# Patient Record
Sex: Female | Born: 1965
Health system: Southern US, Community
[De-identification: ages and names within clinical notes are randomized; demographics above are authoritative.]

## PROBLEM LIST (undated history)

## (undated) DIAGNOSIS — Z8639 Personal history of other endocrine, nutritional and metabolic disease: Secondary | ICD-10-CM

## (undated) DIAGNOSIS — E785 Hyperlipidemia, unspecified: Secondary | ICD-10-CM

## (undated) HISTORY — PX: TONSILLECTOMY: SUR1361

## (undated) HISTORY — DX: Hyperlipidemia, unspecified: E78.5

## (undated) HISTORY — DX: Personal history of other endocrine, nutritional and metabolic disease: Z86.39

## (undated) HISTORY — PX: OTHER SURGICAL HISTORY: SHX169

---

## 2013-03-20 LAB — HM PAP SMEAR

## 2013-09-05 LAB — HM MAMMOGRAPHY: HM MAMMO: NORMAL (ref 0–4)

## 2014-07-07 LAB — LIPID PANEL
CHOLESTEROL: 217 mg/dL — AB (ref 0–200)
HDL: 57 mg/dL (ref 35–70)
LDL CALC: 121 mg/dL
LDl/HDL Ratio: 3.8
Triglycerides: 196 mg/dL — AB (ref 40–160)

## 2014-07-07 LAB — BASIC METABOLIC PANEL
Creatinine: 0.7 mg/dL (ref 0.5–1.1)
Glucose: 76 mg/dL
Potassium: 4.9 mmol/L (ref 3.4–5.3)
Sodium: 140 mmol/L (ref 137–147)

## 2014-07-07 LAB — HEPATIC FUNCTION PANEL
ALK PHOS: 56 U/L (ref 25–125)
ALT: 8 U/L (ref 7–35)
AST: 12 U/L — AB (ref 13–35)
Bilirubin, Total: 0.4 mg/dL

## 2014-07-07 LAB — TSH: TSH: 2.08 u[IU]/mL (ref 0.41–5.90)

## 2014-07-07 LAB — CBC AND DIFFERENTIAL
HCT: 43 % (ref 36–46)
Hemoglobin: 13.9 g/dL (ref 12.0–16.0)
NEUTROS ABS: 6607 /uL
PLATELETS: 260 10*3/uL (ref 150–399)
WBC: 9.1 10*3/mL

## 2014-07-07 LAB — VITAMIN B12: Vitamin B-12: 602

## 2016-04-04 LAB — HM PAP SMEAR

## 2016-05-09 ENCOUNTER — Other Ambulatory Visit: Payer: Self-pay | Admitting: Obstetrics and Gynecology

## 2016-05-09 DIAGNOSIS — R928 Other abnormal and inconclusive findings on diagnostic imaging of breast: Secondary | ICD-10-CM

## 2016-05-11 ENCOUNTER — Ambulatory Visit
Admission: RE | Admit: 2016-05-11 | Discharge: 2016-05-11 | Disposition: A | Discharge status: Home / Self Care | Payer: 59 | Source: Ambulatory Visit | Attending: Obstetrics and Gynecology | Admitting: Obstetrics and Gynecology

## 2016-05-11 DIAGNOSIS — R928 Other abnormal and inconclusive findings on diagnostic imaging of breast: Secondary | ICD-10-CM

## 2016-06-21 ENCOUNTER — Encounter: Payer: Self-pay | Admitting: General Practice

## 2016-09-19 ENCOUNTER — Ambulatory Visit: Payer: 59 | Admitting: Family Medicine

## 2016-10-02 ENCOUNTER — Encounter: Payer: Self-pay | Admitting: General Practice

## 2016-10-02 ENCOUNTER — Encounter: Payer: Self-pay | Admitting: Family Medicine

## 2016-10-02 ENCOUNTER — Ambulatory Visit (INDEPENDENT_AMBULATORY_CARE_PROVIDER_SITE_OTHER): Payer: 59 | Admitting: Family Medicine

## 2016-10-02 DIAGNOSIS — E669 Obesity, unspecified: Secondary | ICD-10-CM

## 2016-10-02 DIAGNOSIS — R7989 Other specified abnormal findings of blood chemistry: Secondary | ICD-10-CM

## 2016-10-02 DIAGNOSIS — E538 Deficiency of other specified B group vitamins: Secondary | ICD-10-CM | POA: Diagnosis not present

## 2016-10-02 DIAGNOSIS — E785 Hyperlipidemia, unspecified: Secondary | ICD-10-CM | POA: Diagnosis not present

## 2016-10-02 LAB — LIPID PANEL
Cholesterol: 195 mg/dL (ref 0–200)
HDL: 47.7 mg/dL (ref >39.00)
NONHDL: 147.26
Total CHOL/HDL Ratio: 4
Triglycerides: 201 mg/dL — ABNORMAL HIGH (ref 0.0–149.0)
VLDL: 40.2 mg/dL — AB (ref 0.0–40.0)

## 2016-10-02 LAB — HEPATIC FUNCTION PANEL
ALK PHOS: 54 U/L (ref 39–117)
ALT: 9 U/L (ref 0–35)
AST: 11 U/L (ref 0–37)
Albumin: 3.7 g/dL (ref 3.5–5.2)
BILIRUBIN TOTAL: 0.4 mg/dL (ref 0.2–1.2)
Bilirubin, Direct: 0.1 mg/dL (ref 0.0–0.3)
Total Protein: 6.3 g/dL (ref 6.0–8.3)

## 2016-10-02 LAB — BASIC METABOLIC PANEL
BUN: 12 mg/dL (ref 6–23)
CALCIUM: 9.2 mg/dL (ref 8.4–10.5)
CO2: 27 meq/L (ref 19–32)
Chloride: 101 mEq/L (ref 96–112)
Creatinine, Ser: 0.72 mg/dL (ref 0.40–1.20)
GFR: 91.43 mL/min (ref >60.00)
GLUCOSE: 86 mg/dL (ref 70–99)
POTASSIUM: 4.2 meq/L (ref 3.5–5.1)
SODIUM: 139 meq/L (ref 135–145)

## 2016-10-02 LAB — CBC WITH DIFFERENTIAL/PLATELET
BASOS PCT: 1 % (ref 0.0–3.0)
Basophils Absolute: 0.1 10*3/uL (ref 0.0–0.1)
EOS PCT: 2.6 % (ref 0.0–5.0)
Eosinophils Absolute: 0.2 10*3/uL (ref 0.0–0.7)
HEMATOCRIT: 43.1 % (ref 36.0–46.0)
Hemoglobin: 14.4 g/dL (ref 12.0–15.0)
LYMPHS ABS: 2.2 10*3/uL (ref 0.7–4.0)
LYMPHS PCT: 27.2 % (ref 12.0–46.0)
MCHC: 33.4 g/dL (ref 30.0–36.0)
MCV: 85.8 fl (ref 78.0–100.0)
MONOS PCT: 5.5 % (ref 3.0–12.0)
Monocytes Absolute: 0.4 10*3/uL (ref 0.1–1.0)
NEUTROS ABS: 5.1 10*3/uL (ref 1.4–7.7)
NEUTROS PCT: 63.7 % (ref 43.0–77.0)
PLATELETS: 268 10*3/uL (ref 150.0–400.0)
RBC: 5.02 Mil/uL (ref 3.87–5.11)
RDW: 14.2 % (ref 11.5–15.5)
WBC: 8.1 10*3/uL (ref 4.0–10.5)

## 2016-10-02 LAB — VITAMIN B12: Vitamin B-12: 332 pg/mL (ref 211–911)

## 2016-10-02 LAB — LDL CHOLESTEROL, DIRECT: Direct LDL: 133 mg/dL

## 2016-10-02 LAB — TSH: TSH: 3 u[IU]/mL (ref 0.35–4.50)

## 2016-10-02 MED ORDER — NALTREXONE-BUPROPION HCL ER 8-90 MG PO TB12: ORAL_TABLET | ORAL | 3 refills | Status: DC

## 2016-10-02 NOTE — Assessment & Plan Note (Signed)
New.  Pt reports she has had elevated levels in the past but has never been on meds.  Stressed need for healthy diet and regular exercise.  Check labs.  Start meds prn.

## 2016-10-02 NOTE — Patient Instructions (Addendum)
Follow up in 6-8 weeks to recheck weight loss progress We'll notify you of your lab results and make any changes if needed Continue to work on healthy diet and regular exercise- you can do it!! Start the Contrave as directed- 1 tab daily x1 week and then increase to 1 tab twice daily x1 week, then increase to 2 tabs in the AM and 1 tab in the evening x1 week and then get to goal of 2 tabs twice daily Increase your water intake Call with any questions or concerns Welcome!  We're glad to have you!!!

## 2016-10-02 NOTE — Assessment & Plan Note (Signed)
New.  Pt [place]  Stressed need for healthy diet and regular exercise.  Check labs to risk stratify.  Start Contrave at pt's request.  Reviewed appropriate use, possible side effects.  Pt expressed understanding and is in agreement w/ plan.

## 2016-10-02 NOTE — Progress Notes (Signed)
   Subjective:    Patient ID: Jacqueline Mcfarland, female    DOB: 09/13/1965, 51 y.o.   MRN: [id number]  HPI New to establish.  [facility]  Previous PCP[place]  B12 deficiency- pt is doing home B12 injxns every 10 days for last 7 yrs.  Last B12 level was within 6 months  Hyperlipidemia- pt reports hx of elevated cholesterol but never on medication.  Has been attempting to control w/ healthy diet and regular exercise.  Fish oil was recommended but not started.  Weight gain- pt [place] [facility]  Pt is interested in starting Contrave to help w/ weight loss.  Pt just started exercising daily which is what she used to do.  She also finds herself eating later in the evening.  No CP, SOB, HAs, edema, abd pain, N/V.   Review of Systems For ROS see HPI     Objective:   Physical Exam  Constitutional: She is oriented to person, place, and time. She appears well-developed and well-nourished. No distress.  obese  HENT:  Head: Normocephalic and atraumatic.  Eyes: Pupils are equal, round, and reactive to light. Conjunctivae and EOM are normal.  Neck: Normal range of motion. Neck supple. No thyromegaly present.  Cardiovascular: Normal rate, regular rhythm, normal heart sounds and intact distal pulses.   No murmur heard. Pulmonary/Chest: Effort normal and breath sounds normal. No respiratory distress.  Abdominal: Soft. She exhibits no distension. There is no tenderness.  Musculoskeletal: She exhibits no edema.  Lymphadenopathy:    She has no cervical adenopathy.  Neurological: She is alert and oriented to person, place, and time.  Skin: Skin is warm and dry.  Psychiatric: She has a normal mood and affect. Her behavior is normal.  Vitals reviewed.         Assessment & Plan:

## 2016-10-02 NOTE — Progress Notes (Signed)
Pre visit review using our clinic review tool, if applicable. No additional management support is needed unless otherwise documented below in the visit note. 

## 2016-10-02 NOTE — Assessment & Plan Note (Signed)
New to provider, ongoing for pt.  She is doing home injxns.  Check level.  Refill prn.

## 2016-10-20 ENCOUNTER — Ambulatory Visit (INDEPENDENT_AMBULATORY_CARE_PROVIDER_SITE_OTHER): Payer: 59 | Admitting: Physician Assistant

## 2016-10-20 ENCOUNTER — Encounter: Payer: Self-pay | Admitting: Physician Assistant

## 2016-10-20 VITALS — BP 118/80 | HR 76 | Temp 98.2°F | Resp 17 | Ht 66.0 in | Wt 194.0 lb

## 2016-10-20 DIAGNOSIS — J0100 Acute maxillary sinusitis, unspecified: Secondary | ICD-10-CM

## 2016-10-20 MED ORDER — FLUTICASONE PROPIONATE 50 MCG/ACT NA SUSP: 2.0000 | Freq: Every day | NASAL | 0 refills | Status: DC

## 2016-10-20 MED ORDER — AMOXICILLIN-POT CLAVULANATE 875-125 MG PO TABS: 1.0000 | ORAL_TABLET | Freq: Two times a day (BID) | ORAL | 0 refills | Status: DC

## 2016-10-20 NOTE — Progress Notes (Signed)
Pre visit review using our clinic review tool, if applicable. No additional management support is needed unless otherwise documented below in the visit note. 

## 2016-10-20 NOTE — Progress Notes (Signed)
Patient presents to clinic today c/o several days of ear pressure and sinus pressure now with facial/maxillary pain bilaterally with R ear pain. Denies fever, chills or aches. Has felt fatigued. Denies recent travel or sick contact. Is taking Ibuprofen which is helping somewhat. Patient with history significant for sinusitis.   Past Medical History:  Diagnosis Date  . H/O non anemic vitamin B12 deficiency   . Hyperlipidemia     Current Outpatient Prescriptions on File Prior to Visit  Medication Sig Dispense Refill  . cyanocobalamin (,VITAMIN B-12,) 1000 MCG/ML injection cyanocobalamin (vit B-12) 1,000 mcg/mL injection solution  INJECT 1 ML  Q 10 DAYS AS DIRECTED    . Norgestimate-Ethinyl Estradiol Triphasic (TRI-LO-SPRINTEC) 0.18/0.215/0.25 MG-25 MCG tab Take 1 tablet by mouth daily.    . Naltrexone-Bupropion HCl ER (CONTRAVE) 8-90 MG TB12 1 tab daily x1 week, then 1 tab BID x1 week, then 2 tabs qAM and 1 tab qPM x1 week, then 2 tabs BID (Patient not taking: Reported on 10/20/2016) 120 tablet 3   No current facility-administered medications on file prior to visit.     No Known Allergies  Family History  Problem Relation Age of Onset  . Diabetes Mother   . Hyperlipidemia Mother   . COPD Mother   . Heart disease Mother   . Cancer Father        lung, liver, and pancreatic  . Stroke Father   . Diabetes Brother   . Mental illness Maternal Grandfather   . Heart disease Maternal Grandfather     Social History   Social History  . Marital status: Married    Spouse name: N/A  . Number of children: N/A  . Years of education: N/A   Social History Main Topics  . Smoking status: Former Smoker    Types: Cigarettes    Quit date: 10/03/1991  . Smokeless tobacco: Never Used  . Alcohol use Yes  . Drug use: No  . Sexual activity: Yes   Other Topics Concern  . None   Social History Narrative  . None   Review of Systems - See HPI.  All other ROS are negative.  BP 118/80   Pulse  76   Temp 98.2 F (36.8 C) (Oral)   Resp 17   Ht 5\' 6"  (1.676 m)   Wt 194 lb (88 kg)   SpO2 98%   BMI 31.31 kg/m   Physical Exam  Constitutional: She is oriented to person, place, and time and well-developed, well-nourished, and in no distress.  HENT:  Head: Normocephalic and atraumatic.  Right Ear: Tympanic membrane is not erythematous and not bulging.  Left Ear: Tympanic membrane is not erythematous and not bulging.  Nose: Mucosal edema and rhinorrhea present. Right sinus exhibits maxillary sinus tenderness. Left sinus exhibits maxillary sinus tenderness.  Mouth/Throat: Uvula is midline, oropharynx is clear and moist and mucous membranes are normal.  + serous fluid noted behind TM bilaterally.   Eyes: Conjunctivae are normal.  Neck: Neck supple.  Cardiovascular: Normal rate, regular rhythm, normal heart sounds and intact distal pulses.   Pulmonary/Chest: Effort normal and breath sounds normal. No respiratory distress. She has no wheezes. She has no rales. She exhibits no tenderness.  Lymphadenopathy:    She has no cervical adenopathy.  Neurological: She is alert and oriented to person, place, and time.  Skin: Skin is warm and dry. No rash noted.  Psychiatric: Affect normal.  Vitals reviewed.  Recent Results (from the past 2160 hour(s))  Lipid  panel     Status: Abnormal   Collection Time: 10/02/16  8:48 AM  Result Value Ref Range   Cholesterol 195 0 - 200 mg/dL    Comment: ATP III Classification       Desirable:  < 200 mg/dL               Borderline High:  200 - 239 mg/dL          High:  > = 161 mg/dL   Triglycerides 096.0 (H) 0.0 - 149.0 mg/dL    Comment: Normal:  <454 mg/dLBorderline High:  150 - 199 mg/dL   HDL 09.81 >19.14 mg/dL   VLDL 78.2 (H) 0.0 - 95.6 mg/dL   Total CHOL/HDL Ratio 4     Comment:                Men          Women1/2 Average Risk     3.4          3.3Average Risk          5.0          4.42X Average Risk          9.6          7.13X Average Risk           15.0          11.0                       NonHDL 147.26     Comment: NOTE:  Non-HDL goal should be 30 mg/dL higher than patient's LDL goal (i.e. LDL goal of < 70 mg/dL, would have non-HDL goal of < 100 mg/dL)  Basic metabolic panel     Status: None   Collection Time: 10/02/16  8:48 AM  Result Value Ref Range   Sodium 139 135 - 145 mEq/L   Potassium 4.2 3.5 - 5.1 mEq/L   Chloride 101 96 - 112 mEq/L   CO2 27 19 - 32 mEq/L   Glucose, Bld 86 70 - 99 mg/dL   BUN 12 6 - 23 mg/dL   Creatinine, Ser 2.13 0.40 - 1.20 mg/dL   Calcium 9.2 8.4 - 08.6 mg/dL   GFR 57.84 >69.62 mL/min  TSH     Status: None   Collection Time: 10/02/16  8:48 AM  Result Value Ref Range   TSH 3.00 0.35 - 4.50 uIU/mL  Hepatic function panel     Status: None   Collection Time: 10/02/16  8:48 AM  Result Value Ref Range   Total Bilirubin 0.4 0.2 - 1.2 mg/dL   Bilirubin, Direct 0.1 0.0 - 0.3 mg/dL   Alkaline Phosphatase 54 39 - 117 U/L   AST 11 0 - 37 U/L   ALT 9 0 - 35 U/L   Total Protein 6.3 6.0 - 8.3 g/dL   Albumin 3.7 3.5 - 5.2 g/dL  CBC with Differential/Platelet     Status: None   Collection Time: 10/02/16  8:48 AM  Result Value Ref Range   WBC 8.1 4.0 - 10.5 K/uL   RBC 5.02 3.87 - 5.11 Mil/uL   Hemoglobin 14.4 12.0 - 15.0 g/dL   HCT 95.2 84.1 - 32.4 %   MCV 85.8 78.0 - 100.0 fl   MCHC 33.4 30.0 - 36.0 g/dL   RDW 40.1 02.7 - 25.3 %   Platelets 268.0 150.0 - 400.0 K/uL   Neutrophils Relative % 63.7 43.0 - 77.0 %  Lymphocytes Relative 27.2 12.0 - 46.0 %   Monocytes Relative 5.5 3.0 - 12.0 %   Eosinophils Relative 2.6 0.0 - 5.0 %   Basophils Relative 1.0 0.0 - 3.0 %   Neutro Abs 5.1 1.4 - 7.7 K/uL   Lymphs Abs 2.2 0.7 - 4.0 K/uL   Monocytes Absolute 0.4 0.1 - 1.0 K/uL   Eosinophils Absolute 0.2 0.0 - 0.7 K/uL   Basophils Absolute 0.1 0.0 - 0.1 K/uL  B12     Status: None   Collection Time: 10/02/16  8:48 AM  Result Value Ref Range   Vitamin B-12 332 211 - 911 pg/mL  LDL cholesterol, direct     Status:  None   Collection Time: 10/02/16  8:48 AM  Result Value Ref Range   Direct LDL 133.0 mg/dL    Comment: Optimal:  <960<100 mg/dLNear or Above Optimal:  100-129 mg/dLBorderline High:  130-159 mg/dLHigh:  160-189 mg/dLVery High:  >190 mg/dL   Assessment/Plan: 1. Acute non-recurrent maxillary sinusitis Rx Augmentin and Flonase.  Increase fluids.  Rest.  Saline nasal spray.  Probiotic.  Mucinex as directed.  Humidifier in bedroom.  Call or return to clinic if symptoms are not improving.  - amoxicillin-clavulanate (AUGMENTIN) 875-125 MG tablet; Take 1 tablet by mouth 2 (two) times daily.  Dispense: 14 tablet; Refill: 0 - fluticasone (FLONASE) 50 MCG/ACT nasal spray; Place 2 sprays into both nostrils daily.  Dispense: 16 g; Refill: 0   Piedad ClimesMartin, Damita Eppard [place], [place]

## 2016-10-20 NOTE — Patient Instructions (Signed)
Please take antibiotic as directed.  Increase fluid intake.  Use Saline nasal spray.  Take a daily multivitamin. Start Flonase as directed (I have sent in a script for this).  Place a humidifier in the bedroom.  Please call or return clinic if symptoms are not improving.  Sinusitis Sinusitis is redness, soreness, and swelling (inflammation) of the paranasal sinuses. Paranasal sinuses are air pockets within the bones of your face (beneath the eyes, the middle of the forehead, or above the eyes). In healthy paranasal sinuses, mucus is able to drain out, and air is able to circulate through them by way of your nose. However, when your paranasal sinuses are inflamed, mucus and air can become trapped. This can allow bacteria and other germs to grow and cause infection. Sinusitis can develop quickly and last only a short time (acute) or continue over a long period (chronic). Sinusitis that lasts for more than 12 weeks is considered chronic.  CAUSES  Causes of sinusitis include:  Allergies.  Structural abnormalities, such as displacement of the cartilage that separates your nostrils (deviated septum), which can decrease the air flow through your nose and sinuses and affect sinus drainage.  Functional abnormalities, such as when the small hairs (cilia) that line your sinuses and help remove mucus do not work properly or are not present. SYMPTOMS  Symptoms of acute and chronic sinusitis are the same. The primary symptoms are pain and pressure around the affected sinuses. Other symptoms include:  Upper toothache.  Earache.  Headache.  Bad breath.  Decreased sense of smell and taste.  A cough, which worsens when you are lying flat.  Fatigue.  Fever.  Thick drainage from your nose, which often is green and may contain pus (purulent).  Swelling and warmth over the affected sinuses. DIAGNOSIS  Your caregiver will perform a physical exam. During the exam, your caregiver may:  Look in your nose  for signs of abnormal growths in your nostrils (nasal polyps).  Tap over the affected sinus to check for signs of infection.  View the inside of your sinuses (endoscopy) with a special imaging device with a light attached (endoscope), which is inserted into your sinuses. If your caregiver suspects that you have chronic sinusitis, one or more of the following tests may be recommended:  Allergy tests.  Nasal culture A sample of mucus is taken from your nose and sent to a lab and screened for bacteria.  Nasal cytology A sample of mucus is taken from your nose and examined by your caregiver to determine if your sinusitis is related to an allergy. TREATMENT  Most cases of acute sinusitis are related to a viral infection and will resolve on their own within 10 days. Sometimes medicines are prescribed to help relieve symptoms (pain medicine, decongestants, nasal steroid sprays, or saline sprays).  However, for sinusitis related to a bacterial infection, your caregiver will prescribe antibiotic medicines. These are medicines that will help kill the bacteria causing the infection.  Rarely, sinusitis is caused by a fungal infection. In theses cases, your caregiver will prescribe antifungal medicine. For some cases of chronic sinusitis, surgery is needed. Generally, these are cases in which sinusitis recurs more than 3 times per year, despite other treatments. HOME CARE INSTRUCTIONS   Drink plenty of water. Water helps thin the mucus so your sinuses can drain more easily.  Use a humidifier.  Inhale steam 3 to 4 times a day (for example, sit in the bathroom with the shower running).  Apply a  warm, moist washcloth to your face 3 to 4 times a day, or as directed by your caregiver.  Use saline nasal sprays to help moisten and clean your sinuses.  Take over-the-counter or prescription medicines for pain, discomfort, or fever only as directed by your caregiver. SEEK IMMEDIATE MEDICAL CARE IF:  You  have increasing pain or severe headaches.  You have nausea, vomiting, or drowsiness.  You have swelling around your face.  You have vision problems.  You have a stiff neck.  You have difficulty breathing. MAKE SURE YOU:   Understand these instructions.  Will watch your condition.  Will get help right away if you are not doing well or get worse. Document Released: 03/06/2005 Document Revised: 05/29/2011 Document Reviewed: 03/21/2011 [facility] Patient Information 2014 [employer] [place].

## 2016-10-23 ENCOUNTER — Other Ambulatory Visit: Payer: Self-pay | Admitting: Obstetrics and Gynecology

## 2016-10-23 DIAGNOSIS — N6489 Other specified disorders of breast: Secondary | ICD-10-CM

## 2016-11-01 ENCOUNTER — Ambulatory Visit (INDEPENDENT_AMBULATORY_CARE_PROVIDER_SITE_OTHER): Payer: 59 | Admitting: Family Medicine

## 2016-11-01 ENCOUNTER — Encounter: Payer: Self-pay | Admitting: Family Medicine

## 2016-11-01 VITALS — BP 118/78 | HR 78 | Temp 98.5°F | Resp 17 | Ht 66.0 in | Wt 196.1 lb

## 2016-11-01 DIAGNOSIS — J0110 Acute frontal sinusitis, unspecified: Secondary | ICD-10-CM | POA: Diagnosis not present

## 2016-11-01 MED ORDER — SULFAMETHOXAZOLE-TRIMETHOPRIM 800-160 MG PO TABS: 1.0000 | ORAL_TABLET | Freq: Two times a day (BID) | ORAL | 0 refills | Status: DC

## 2016-11-01 NOTE — Progress Notes (Signed)
   Subjective:    Patient ID: Jacqueline Mcfarland, female    DOB: 08/30/1965, 51 y.o.   MRN: [id number]  HPI Sinusitis- pt was seen 8/3 and dx'd w/ sinus infxn.  Started on Augmentin but stopped meds after only completing 1/2 course (5 days) due to GI issues.  Pt feels sinus infxn has never resolved.  Continues to have facial pain, ear pain- having popping and clicking in her R (deaf) ear.  No fevers.  Pt has flight upcoming and her deafness is due to barotrauma from previous flight.   Review of Systems For ROS see HPI     Objective:   Physical Exam  Constitutional: She appears well-developed and well-nourished. No distress.  HENT:  Head: Normocephalic and atraumatic.  Right Ear: Tympanic membrane normal.  Left Ear: Tympanic membrane normal.  Nose: Mucosal edema and rhinorrhea present. Right sinus exhibits frontal sinus tenderness. Right sinus exhibits no maxillary sinus tenderness. Left sinus exhibits frontal sinus tenderness. Left sinus exhibits no maxillary sinus tenderness.  Mouth/Throat: Uvula is midline and mucous membranes are normal. Posterior oropharyngeal erythema present. No oropharyngeal exudate.  Eyes: Pupils are equal, round, and reactive to light. Conjunctivae and EOM are normal.  Neck: Normal range of motion. Neck supple.  Cardiovascular: Normal rate, regular rhythm and normal heart sounds.   Pulmonary/Chest: Effort normal and breath sounds normal. No respiratory distress. She has no wheezes.  Lymphadenopathy:    She has no cervical adenopathy.  Vitals reviewed.         Assessment & Plan:  Frontal sinusitis- new to provider, ongoing for pt.  Incomplete tx of previous infxn.  Switch to Bactrim due to intolerance to Augmentin.  Stressed need to use Flonase daily- at least until feeling better.  Reviewed supportive care and red flags that should prompt return.  Pt expressed understanding and is in agreement w/ plan.

## 2016-11-01 NOTE — Patient Instructions (Signed)
Follow up as needed or scheduled Start the Bactrim twice daily- take w/ food Restart the Flonase- 2 sprays each nostril until feeling better Drink plenty of fluids Call with any questions or concerns ENJOY YOUR TRIP!!!

## 2016-11-01 NOTE — Progress Notes (Signed)
Pre visit review using our clinic review tool, if applicable. No additional management support is needed unless otherwise documented below in the visit note. 

## 2016-11-08 ENCOUNTER — Telehealth: Payer: Self-pay | Admitting: Family Medicine

## 2016-11-08 DIAGNOSIS — H9209 Otalgia, unspecified ear: Secondary | ICD-10-CM

## 2016-11-08 DIAGNOSIS — H938X9 Other specified disorders of ear, unspecified ear: Secondary | ICD-10-CM

## 2016-11-08 NOTE — Telephone Encounter (Signed)
Please advise 

## 2016-11-08 NOTE — Telephone Encounter (Signed)
Recommend daily Zyrtec and Flonase.  Add short term sudafed (3-5 days) to improve ear pressure.  Drink LOTS of fluids.  No need for additional abx at this time

## 2016-11-08 NOTE — Telephone Encounter (Signed)
Pt states that she is still feeling bad from her visit 8/15 from sinus, pt states that she still has pressure in her ear, pt asking what should she do, please advise

## 2016-11-08 NOTE — Telephone Encounter (Signed)
Patient notified of PCP recommendations and is agreement and expresses an understanding.  

## 2016-11-10 ENCOUNTER — Ambulatory Visit: Payer: 59 | Admitting: Physician Assistant

## 2016-11-10 NOTE — Telephone Encounter (Addendum)
Patient has scheduled an appointment with [facility] this afternoon.  She would like [place] to see if we can get her in with an ENT today, if so, we can cancel the appointment with [facility], if not she will come here to be seen.  Marylene Land is aware of referral and will begin working on it.

## 2016-11-10 NOTE — Telephone Encounter (Signed)
Patient states that she is miserable and cannot continue to feel like this. She is requesting an urgent referral to an ENT to see if there is any way she can be seen today because she does not want to go through the weekend like this.

## 2016-11-10 NOTE — Telephone Encounter (Signed)
Per Marylene Land, Patient scheduled at an ENT office today.  Appt with [facility] cancelled.   Patient aware.

## 2016-11-10 NOTE — Telephone Encounter (Signed)
We can certainly refer to ENT but i'm not sure they will be able to see her today.  If her sxs are that severe and ENT is unable to see her, I recommend that she have an appt to be evaluated either here in our office or at another site.

## 2016-11-10 NOTE — Addendum Note (Signed)
Addended by: Lenis Dickinson on: 11/10/2016 10:30 AM   Modules accepted: Orders

## 2016-11-10 NOTE — Telephone Encounter (Signed)
Can you see if she is willing to be seen?

## 2016-11-14 ENCOUNTER — Ambulatory Visit: Payer: 59 | Admitting: Family Medicine

## 2016-11-21 ENCOUNTER — Ambulatory Visit
Admission: RE | Admit: 2016-11-21 | Discharge: 2016-11-21 | Disposition: A | Discharge status: Home / Self Care | Payer: 59 | Source: Ambulatory Visit | Attending: Obstetrics and Gynecology | Admitting: Obstetrics and Gynecology

## 2016-11-21 DIAGNOSIS — N6489 Other specified disorders of breast: Secondary | ICD-10-CM

## 2017-01-19 ENCOUNTER — Ambulatory Visit: Payer: 59 | Admitting: Physician Assistant

## 2017-01-19 DIAGNOSIS — Z0289 Encounter for other administrative examinations: Secondary | ICD-10-CM

## 2017-05-07 ENCOUNTER — Encounter: Payer: Self-pay | Admitting: General Practice

## 2017-05-18 LAB — HM MAMMOGRAPHY: HM Mammogram: NORMAL (ref 0–4)

## 2017-06-20 ENCOUNTER — Ambulatory Visit: Payer: Self-pay | Admitting: *Deleted

## 2017-06-20 ENCOUNTER — Other Ambulatory Visit: Payer: Self-pay

## 2017-06-20 ENCOUNTER — Ambulatory Visit: Payer: 59 | Admitting: Family Medicine

## 2017-06-20 ENCOUNTER — Encounter: Payer: Self-pay | Admitting: Family Medicine

## 2017-06-20 VITALS — BP 121/72 | HR 70 | Temp 98.1°F | Resp 16 | Ht 66.0 in | Wt 191.5 lb

## 2017-06-20 DIAGNOSIS — E538 Deficiency of other specified B group vitamins: Secondary | ICD-10-CM | POA: Diagnosis not present

## 2017-06-20 DIAGNOSIS — Z724 Inappropriate diet and eating habits: Secondary | ICD-10-CM

## 2017-06-20 DIAGNOSIS — H8110 Benign paroxysmal vertigo, unspecified ear: Secondary | ICD-10-CM

## 2017-06-20 DIAGNOSIS — E860 Dehydration: Secondary | ICD-10-CM

## 2017-06-20 LAB — CBC WITH DIFFERENTIAL/PLATELET
BASOS ABS: 0 10*3/uL (ref 0.0–0.1)
Basophils Relative: 0.4 % (ref 0.0–3.0)
Eosinophils Absolute: 0.1 10*3/uL (ref 0.0–0.7)
Eosinophils Relative: 1.4 % (ref 0.0–5.0)
HCT: 43.8 % (ref 36.0–46.0)
Hemoglobin: 14.8 g/dL (ref 12.0–15.0)
LYMPHS ABS: 2.8 10*3/uL (ref 0.7–4.0)
Lymphocytes Relative: 28.1 % (ref 12.0–46.0)
MCHC: 33.8 g/dL (ref 30.0–36.0)
MCV: 85.5 fl (ref 78.0–100.0)
MONOS PCT: 5 % (ref 3.0–12.0)
Monocytes Absolute: 0.5 10*3/uL (ref 0.1–1.0)
NEUTROS PCT: 65.1 % (ref 43.0–77.0)
Neutro Abs: 6.6 10*3/uL (ref 1.4–7.7)
PLATELETS: 330 10*3/uL (ref 150.0–400.0)
RBC: 5.13 Mil/uL — AB (ref 3.87–5.11)
RDW: 14.5 % (ref 11.5–15.5)
WBC: 10.1 10*3/uL (ref 4.0–10.5)

## 2017-06-20 LAB — BASIC METABOLIC PANEL
BUN: 16 mg/dL (ref 6–23)
CALCIUM: 9.6 mg/dL (ref 8.4–10.5)
CO2: 29 mEq/L (ref 19–32)
Chloride: 99 mEq/L (ref 96–112)
Creatinine, Ser: 0.76 mg/dL (ref 0.40–1.20)
GFR: 85.65 mL/min (ref >60.00)
Glucose, Bld: 78 mg/dL (ref 70–99)
Potassium: 4.7 mEq/L (ref 3.5–5.1)
SODIUM: 137 meq/L (ref 135–145)

## 2017-06-20 LAB — TSH: TSH: 1.62 u[IU]/mL (ref 0.35–4.50)

## 2017-06-20 LAB — VITAMIN B12: VITAMIN B 12: 525 pg/mL (ref 211–911)

## 2017-06-20 MED ORDER — MECLIZINE HCL 25 MG PO TABS: 25.0000 mg | ORAL_TABLET | Freq: Three times a day (TID) | ORAL | 0 refills | Status: DC | PRN

## 2017-06-20 MED ORDER — FLUTICASONE PROPIONATE 50 MCG/ACT NA SUSP: 2.0000 | Freq: Every day | NASAL | 6 refills | Status: DC

## 2017-06-20 NOTE — Telephone Encounter (Signed)
Patient is reporting dizzy spells for a week now- gets cold sweat and feels like she is going to pass out- very random  Reason for Disposition . [1] MODERATE dizziness (e.g., interferes with normal activities) AND [2] has NOT been evaluated by physician for this  (Exception: dizziness caused by heat exposure, sudden standing, or poor fluid intake)  Answer Assessment - Initial Assessment Questions 1. DESCRIPTION: "Describe your dizziness."     Cold sweat and feels dizzy 2. LIGHTHEADED: "Do you feel lightheaded?" (e.g., somewhat faint, woozy, weak upon standing)     Woozy and faint 3. VERTIGO: "Do you feel like either you or the room is spinning or tilting?" (i.e. vertigo)     no 4. SEVERITY: "How bad is it?"  "Do you feel like you are going to faint?" "Can you stand and walk?"   - MILD - walking normally   - MODERATE - interferes with normal activities (e.g., work, school)    - SEVERE - unable to stand, requires support to walk, feels like passing out now.      Patient can not walk or stand when occurring-moderate 5. ONSET:  "When did the dizziness begin?"     Started last Wednesday- patient was on airplane- patient never feels 100%- but she does feel better 6. AGGRAVATING FACTORS: "Does anything make it worse?" (e.g., standing, change in head position)     No- very random- possibly quick turns- looking up/down 7. HEART RATE: "Can you tell me your heart rate?" "How many beats in 15 seconds?"  (Note: not all patients can do this)       60- in minute 8. CAUSE: "What do you think is causing the dizziness?"     unsure 9. RECURRENT SYMPTOM: "Have you had dizziness before?" If so, ask: "When was the last time?" "What happened that time?"     Random and recurrent- today 10. OTHER SYMPTOMS: "Do you have any other symptoms?" (e.g., fever, chest pain, vomiting, diarrhea, bleeding)       No- appetite has dropped- lost 5-6 lbs this week- patient is eating  11. PREGNANCY: "Is there any chance you are  pregnant?" "When was your last menstrual period?"       LMP- last week  Protocols used: DIZZINESS [facility]- LIGHTHEADEDNESS-A-AH

## 2017-06-20 NOTE — Assessment & Plan Note (Signed)
Chronic problem.  Repeat labs and replete prn.

## 2017-06-20 NOTE — Progress Notes (Signed)
   Subjective:    Patient ID: Jacqueline Mcfarland, female    DOB: 11/06/1965, 52 y.o.   MRN: [id number]  HPI Dizziness- sxs started 1 week ago.  Pt has flown x4 in the last 2 weeks.  Pt reports 'it just comes on out of nowhere'.  This AM was sitting at her desk and had a 'cold sweat, nausea' but this does not happen w/ each episode.  Episode this AM lasted 15 minutes.  2 days ago felt better after eating.  Pt is not eating regularly and is 'terrible' about drinking fluids.  Pt reports her water intake has always been poor but the change in eating is recent- husband thinks loss of appetite is stress related.  Husband says work and home are stressful.  No foster child.  No dizziness w/ checking blind spot, some dizziness with turning head at work.  + HAs w/ dizziness- improves w/ ibuprofen.  Dizziness is described as pt spinning.   Review of Systems For ROS see HPI     Objective:   Physical Exam  Constitutional: She is oriented to person, place, and time. She appears well-developed and well-nourished. No distress.  HENT:  Head: Normocephalic and atraumatic.  Mouth/Throat: Uvula is midline and mucous membranes are normal.  R TM retracted, L TM WNL No TTP over sinuses Minimal nasal congestion  Eyes: Pupils are equal, round, and reactive to light. Conjunctivae and EOM are normal.  3-4 beats of horizontal nystagmus when looking R  Neck: Normal range of motion. Neck supple.  Cardiovascular: Normal rate, regular rhythm, normal heart sounds and intact distal pulses.  Pulmonary/Chest: Effort normal and breath sounds normal. No respiratory distress. She has no wheezes. She has no rales.  Musculoskeletal: She exhibits no edema.  Lymphadenopathy:    She has no cervical adenopathy.  Neurological: She is alert and oriented to person, place, and time. She has normal reflexes. No cranial nerve deficit.  Skin: Skin is warm and dry.  Psychiatric: Her behavior is normal. Judgment and thought content normal.    tearful  Vitals reviewed.         Assessment & Plan:  BPV- new.  Suspect pt's dizziness is multifactorial- dehydration, erratic eating, and BPV after recent flights and eustachian tube dysfunction.  Reviewed dx and tx.  Start Meclizine PRN, Flonase daily.  Reviewed supportive care and red flags that should prompt return.  Pt expressed understanding and is in agreement w/ plan.   Dehydration- pt drinks 'maybe 1 cup of coffee' daily.  Stressed need for increased fluid intake as her HR jumped 44 points from sitting to standing.  Pt expressed understanding and is in agreement w/ plan.   Erractic eating- stressed need for at least 3 meals daily to avoid hypoglycemic episodes (which is likely contributing to dizziness).  Also discussed protein based snacks late morning and late afternoon.  Pt expressed understanding and is in agreement w/ plan.

## 2017-06-20 NOTE — Patient Instructions (Signed)
Follow up as needed or as scheduled We'll notify you of your lab results and make any changes if needed Make sure you increase your fluid/water intake!  Start small w/ a goal of 1 water bottle in the morning and 1 in the afternoon Make sure you are eating regularly- at least 3 meals daily, but ideally snacks as well (protein based is best) Use the Meclizine as needed for dizziness Change positions slowly- allow your body time to adjust Call with any questions or concerns Hang in there!  You can do this!!

## 2017-06-21 ENCOUNTER — Encounter: Payer: Self-pay | Admitting: General Practice

## 2017-06-22 ENCOUNTER — Other Ambulatory Visit: Payer: Self-pay | Admitting: General Practice

## 2017-06-22 ENCOUNTER — Encounter: Payer: Self-pay | Admitting: Family Medicine

## 2017-06-22 MED ORDER — CYANOCOBALAMIN 1000 MCG/ML IJ SOLN: INTRAMUSCULAR | 6 refills | Status: DC

## 2017-07-27 ENCOUNTER — Ambulatory Visit: Payer: 59 | Admitting: Family Medicine

## 2017-07-27 ENCOUNTER — Other Ambulatory Visit: Payer: Self-pay

## 2017-07-27 ENCOUNTER — Encounter: Payer: Self-pay | Admitting: Family Medicine

## 2017-07-27 ENCOUNTER — Ambulatory Visit (INDEPENDENT_AMBULATORY_CARE_PROVIDER_SITE_OTHER): Payer: 59

## 2017-07-27 VITALS — BP 122/78 | HR 75 | Temp 98.0°F | Wt 196.0 lb

## 2017-07-27 DIAGNOSIS — J0100 Acute maxillary sinusitis, unspecified: Secondary | ICD-10-CM

## 2017-07-27 DIAGNOSIS — M542 Cervicalgia: Secondary | ICD-10-CM

## 2017-07-27 MED ORDER — MELOXICAM 7.5 MG PO TABS: 7.5000 mg | ORAL_TABLET | Freq: Two times a day (BID) | ORAL | 0 refills | Status: DC

## 2017-07-27 MED ORDER — AMOXICILLIN 875 MG PO TABS: 875.0000 mg | ORAL_TABLET | Freq: Two times a day (BID) | ORAL | 0 refills | Status: AC

## 2017-07-27 MED ORDER — CYCLOBENZAPRINE HCL 10 MG PO TABS: 10.0000 mg | ORAL_TABLET | Freq: Every day | ORAL | 0 refills | Status: DC

## 2017-07-27 NOTE — Progress Notes (Signed)
Subjective   CC:  Chief Complaint  Patient presents with  . Ear Pain    Right Ear Pain, x 2 months, Doesn't seem to be going away.  . Neck Pain    Right sided x 3 weeks, patient reports that she is very tender in that area.     HPI: Jacqueline Mcfarland is a 52 y.o. female who presents to the office today to address the problems listed above in the chief complaint.  Patient complains of typical URI symptoms including nasal congestion, pnd, mild sore throat,and now dental pain that restarted this am. She had URI sxs with ST and intermittent sharp ear pain since 4/5 - see telephone note. Also, with stiff right neck with pain with neck mvt. No injuyr. No prior neck problems. She denies high fever or productive cough, shortness of breath or significant GI symptoms.  Over-the-counter cold medicines have been minimally or mildly helpful. Her dizziness has resolved. Reports her shoulder feels sore but no radicular sxs. She has used hot water in shower, ice and advil for neck pain with only minimal relief.  I reviewed the patients updated PMH, FH, and SocHx.    Patient Active Problem List   Diagnosis Date Noted  . Obesity (BMI 30.0-34.9) 10/02/2016  . B12 deficiency 10/02/2016  . Hyperlipidemia 10/02/2016   Current Meds  Medication Sig  . cyanocobalamin (,VITAMIN B-12,) 1000 MCG/ML injection cyanocobalamin (vit B-12) 1,000 mcg/mL injection solution  INJECT 1 ML  Q 10 DAYS AS DIRECTED  . fluticasone (FLONASE) 50 MCG/ACT nasal spray Place 2 sprays into both nostrils daily.  . meclizine (ANTIVERT) 25 MG tablet Take 1 tablet (25 mg total) by mouth 3 (three) times daily as needed for dizziness.  . Norgestimate-Ethinyl Estradiol Triphasic (TRI-LO-SPRINTEC) 0.18/0.215/0.25 MG-25 MCG tab Take 1 tablet by mouth daily.    Review of Systems: Constitutional: Negative for fever malaise or anorexia Cardiovascular: negative for chest pain Respiratory: negative for SOB or pleuritic chest  pain Gastrointestinal: negative for abdominal pain  Objective  Vitals: BP 122/78   Pulse 75   Temp 98 F (36.7 C)   Wt 196 lb (88.9 kg)   LMP 07/13/2017   BMI 31.64 kg/m  General: no acute respiratory distress , sits with stiff neck Psych:  Alert and oriented, normal mood and affect HEENT: Normocephalic, nasal congestion present, TMs w/o erythema, nl landmarks, OP with erythema w/o exudate, + cervical LAD, supple neck but pain with right and left rotation and forward flexion; + paracervical spine ttp and right trap ttp.  Shoulder: nl exam Cardiovascular:  RRR without murmur or gallop. no peripheral edema Respiratory:  Good breath sounds bilaterally, CTAB with normal respiratory effort Skin:  Warm, no rashes Neurologic:   Mental status is normal. normal gait  Assessment  1. Neck pain   2. Acute non-recurrent maxillary sinusitis      Plan    Neck pain with mm spasm: start moist heat, mobic and mm relaxer. Check xray. F/u if not improving.  URI, vs sinusitis. Pt will monitor her sxs and start abx if worsen. Treat supportively with antihistamines, decongestants, and/or cough meds. See AVS for care instructions.   Follow up: prn    Commons side effects, risks, benefits, and alternatives for medications and treatment plan prescribed today were discussed, and the patient expressed understanding of the given instructions. Patient is instructed to call or message via MyChart if he/she has any questions or concerns regarding our treatment plan. No barriers to understanding were identified.  We discussed Red Flag symptoms and signs in detail. Patient expressed understanding regarding what to do in case of urgent or emergency type symptoms.   Medication list was reconciled, printed and provided to the patient in AVS. Patient instructions and summary information was reviewed with the patient as documented in the AVS. This note was prepared with assistance of Dragon voice recognition software.  Occasional wrong-word or sound-a-like substitutions may have occurred due to the inherent limitations of voice recognition software  Orders Placed This Encounter  Procedures  . XR Cervical Spine 2 or 3 views   Meds ordered this encounter  Medications  . amoxicillin (AMOXIL) 875 MG tablet    Sig: Take 1 tablet (875 mg total) by mouth 2 (two) times daily for 10 days.    Dispense:  20 tablet    Refill:  0  . meloxicam (MOBIC) 7.5 MG tablet    Sig: Take 1 tablet (7.5 mg total) by mouth 2 (two) times daily.    Dispense:  30 tablet    Refill:  0  . cyclobenzaprine (FLEXERIL) 10 MG tablet    Sig: Take 1 tablet (10 mg total) by mouth at bedtime.    Dispense:  30 tablet    Refill:  0

## 2017-07-27 NOTE — Patient Instructions (Addendum)
Sorry you're not feeling well!   Please let us know if things do not improve!   Please go to [facility] for your X-ray. No appointment is needed, just let them know we have ordered X-Rays and they will take care of you!  [facility] [address] [place], [place] [address] [phone]     Sinusitis, Adult Sinusitis is soreness and inflammation of your sinuses. Sinuses are hollow spaces in the bones around your face. They are located:  Around your eyes.  In the middle of your forehead.  Behind your nose.  In your cheekbones.  Your sinuses and nasal passages are lined with a stringy fluid (mucus). Mucus normally drains out of your sinuses. When your nasal tissues get inflamed or swollen, the mucus can get trapped or blocked so air cannot flow through your sinuses. This lets bacteria, viruses, and funguses grow, and that leads to infection. Follow these instructions at home: Medicines  Take, use, or apply over-the-counter and prescription medicines only as told by your doctor. These may include nasal sprays.  If you were prescribed an antibiotic medicine, take it as told by your doctor. Do not stop taking the antibiotic even if you start to feel better. Hydrate and Humidify  Drink enough water to keep your pee (urine) clear or pale yellow.  Use a cool mist humidifier to keep the humidity level in your home above 50%.  Breathe in steam for 10-15 minutes, 3-4 times a day or as told by your doctor. You can do this in the bathroom while a hot shower is running.  Try not to spend time in cool or dry air. Rest  Rest as much as possible.  Sleep with your head raised (elevated).  Make sure to get enough sleep each night. General instructions  Put a warm, moist washcloth on your face 3-4 times a day or as told by your doctor. This will help with discomfort.  Wash your hands often with soap and water. If there is no soap and water, use hand sanitizer.  Do  not smoke. Avoid being around people who are smoking (secondhand smoke).  Keep all follow-up visits as told by your doctor. This is important. Contact a doctor if:  You have a fever.  Your symptoms get worse.  Your symptoms do not get better within 10 days. Get help right away if:  You have a very bad headache.  You cannot stop throwing up (vomiting).  You have pain or swelling around your face or eyes.  You have trouble seeing.  You feel confused.  Your neck is stiff.  You have trouble breathing. This information is not intended to replace advice given to you by your health care provider. Make sure you discuss any questions you have with your health care provider. Document Released: 08/23/2007 Document Revised: 10/31/2015 Document Reviewed: 12/30/2014 Elsevier Interactive Patient Education  [employer].

## 2017-07-30 ENCOUNTER — Ambulatory Visit: Payer: 59 | Admitting: Family Medicine

## 2017-12-14 ENCOUNTER — Other Ambulatory Visit: Payer: Self-pay

## 2017-12-14 ENCOUNTER — Encounter: Payer: Self-pay | Admitting: Family Medicine

## 2017-12-14 ENCOUNTER — Ambulatory Visit: Payer: 59 | Admitting: Family Medicine

## 2017-12-14 VITALS — BP 102/81 | HR 78 | Temp 98.1°F | Resp 16 | Ht 66.0 in | Wt 199.5 lb

## 2017-12-14 DIAGNOSIS — J329 Chronic sinusitis, unspecified: Secondary | ICD-10-CM

## 2017-12-14 DIAGNOSIS — B9689 Other specified bacterial agents as the cause of diseases classified elsewhere: Secondary | ICD-10-CM

## 2017-12-14 DIAGNOSIS — J301 Allergic rhinitis due to pollen: Secondary | ICD-10-CM

## 2017-12-14 DIAGNOSIS — J309 Allergic rhinitis, unspecified: Secondary | ICD-10-CM | POA: Insufficient documentation

## 2017-12-14 MED ORDER — AMOXICILLIN 875 MG PO TABS: 875.0000 mg | ORAL_TABLET | Freq: Two times a day (BID) | ORAL | 0 refills | Status: DC

## 2017-12-14 NOTE — Assessment & Plan Note (Signed)
New to provider, ongoing for pt.  Had discussion w/ pt that her untreated seasonal allergies are contributing to her recurrent sinus issues.  Start daily antihistamine.  Continue Flonase but use daily rather than PRN.  Pt expressed understanding and is in agreement w/ plan.

## 2017-12-14 NOTE — Progress Notes (Signed)
   Subjective:    Patient ID: Jacqueline Mcfarland, female    DOB: August 20, 1965, 52 y.o.   MRN: [id number]  HPI URI- 'same as usual, it's a sinus infection'.  Started w/ a mild sore throat 1 week ago.  Bilateral ear pain, R>L.  This AM 'felt like there was a knife in my R ear'.  'severe pain in R side of nose'.  + nasal drainage.  + face pain.  No tooth pain.  No fevers.  No known sick contacts.  + HAs.  No nausea.  No cough.  + sneezing.  Took Aleve D this AM w/ some relief.   Review of Systems For ROS see HPI     Objective:   Physical Exam  Constitutional: She appears well-developed and well-nourished. No distress.  HENT:  Head: Normocephalic and atraumatic.  Right Ear: Tympanic membrane normal.  Left Ear: Tympanic membrane normal.  Nose: Mucosal edema and rhinorrhea present. Right sinus exhibits maxillary sinus tenderness and frontal sinus tenderness. Left sinus exhibits maxillary sinus tenderness and frontal sinus tenderness.  Mouth/Throat: Uvula is midline and mucous membranes are normal. Posterior oropharyngeal erythema present. No oropharyngeal exudate.  Eyes: Pupils are equal, round, and reactive to light. Conjunctivae and EOM are normal.  Neck: Normal range of motion. Neck supple.  Cardiovascular: Normal rate, regular rhythm and normal heart sounds.  Pulmonary/Chest: Effort normal and breath sounds normal. No respiratory distress. She has no wheezes.  Lymphadenopathy:    She has no cervical adenopathy.  Vitals reviewed.         Assessment & Plan:  Sinusitis- recurrent issue for pt.  Reviewed contribution of untreated seasonal allergies.  Start amox.  Reviewed supportive care and red flags that should prompt return.  Pt expressed understanding and is in agreement w/ plan.

## 2017-12-14 NOTE — Patient Instructions (Addendum)
Follow up as needed or as scheduled START daily Claritin or Zyrtec (store brand generic is just as good) until the first frost (Allergy season tends to run Valentine's Day --> 1st frost) USE the Flonase daily- 2 sprays each nostril to improve congestion, ear pain, and drainage Drink plenty of fluids START the Amoxicillin twice daily- take w/ food Call with any questions or concerns Hang in there!

## 2018-06-12 ENCOUNTER — Encounter: Payer: Self-pay | Admitting: Family Medicine

## 2018-06-14 ENCOUNTER — Encounter: Payer: Self-pay | Admitting: Family Medicine

## 2018-06-14 ENCOUNTER — Ambulatory Visit (INDEPENDENT_AMBULATORY_CARE_PROVIDER_SITE_OTHER): Payer: 59 | Admitting: Family Medicine

## 2018-06-14 VITALS — Wt 198.0 lb

## 2018-06-14 DIAGNOSIS — M26623 Arthralgia of bilateral temporomandibular joint: Secondary | ICD-10-CM

## 2018-06-14 DIAGNOSIS — F419 Anxiety disorder, unspecified: Secondary | ICD-10-CM

## 2018-06-14 DIAGNOSIS — E538 Deficiency of other specified B group vitamins: Secondary | ICD-10-CM | POA: Diagnosis not present

## 2018-06-14 MED ORDER — ALPRAZOLAM 0.5 MG PO TABS: ORAL_TABLET | ORAL | 0 refills | Status: DC

## 2018-06-14 MED ORDER — CYANOCOBALAMIN 1000 MCG/ML IJ SOLN: INTRAMUSCULAR | 6 refills | Status: DC

## 2018-06-14 MED ORDER — CYCLOBENZAPRINE HCL 5 MG PO TABS: 5.0000 mg | ORAL_TABLET | Freq: Every day | ORAL | 1 refills | Status: DC

## 2018-06-14 MED ORDER — "SYRINGE/NEEDLE (DISP) 25G X 1"" 3 ML MISC": 1.0000 | 0 refills | Status: DC

## 2018-06-14 MED ORDER — SERTRALINE HCL 25 MG PO TABS: 25.0000 mg | ORAL_TABLET | Freq: Every day | ORAL | 3 refills | Status: DC
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
## 2018-06-19 ENCOUNTER — Encounter: Payer: Self-pay | Admitting: Family Medicine

## 2018-07-01 ENCOUNTER — Encounter: Payer: Self-pay | Admitting: Family Medicine

## 2018-07-01 ENCOUNTER — Other Ambulatory Visit: Payer: Self-pay

## 2018-07-01 ENCOUNTER — Ambulatory Visit (INDEPENDENT_AMBULATORY_CARE_PROVIDER_SITE_OTHER): Payer: 59 | Admitting: Family Medicine

## 2018-07-01 DIAGNOSIS — M26623 Arthralgia of bilateral temporomandibular joint: Secondary | ICD-10-CM

## 2018-07-01 DIAGNOSIS — F419 Anxiety disorder, unspecified: Secondary | ICD-10-CM

## 2018-07-01 MED ORDER — SERTRALINE HCL 50 MG PO TABS: 50.0000 mg | ORAL_TABLET | Freq: Every day | ORAL | 3 refills | Status: DC

## 2018-07-01 MED ORDER — METHOCARBAMOL 500 MG PO TABS: 500.0000 mg | ORAL_TABLET | Freq: Three times a day (TID) | ORAL | 1 refills | Status: DC | PRN

## 2018-07-01 NOTE — Progress Notes (Signed)
Virtual Visit via Video   I connected with Raina Mina on 07/01/18 at  8:30 AM EDT by a video enabled telemedicine application and verified that I am speaking with the correct person using two identifiers. Location patient: Home Location provider: [facility], Office Persons participating in the virtual visit: pt, husband and myself  I discussed the limitations of evaluation and management by telemedicine and the availability of in person appointments. The patient expressed understanding and agreed to proceed.  Subjective:   HPI:  Anxiety- started on Zoloft at last visit.  'I think i'm doing better'.  Finds herself less tearful.  + family hx of 'chemical imbalance' w/ multiple suicides/suicide attempts.  Pt denies SI.  Husband is very supportive.  Pt was admitted to psych ward at age 16 bc mom wanted to spend the summer w/ her 3rd husband.  For this reason (and others) she has a very complicated relationship w/ mom.  Has blocked out most of her childhood.  TMJ- pt was started on Flexeril but this is causing excessive sedation.  Continues to have trouble w/ TMJ.  Found that Alprazolam 1/2 tab relaxes jaw but she doesn't want to take this all the time.  ROS: See pertinent positives and negatives per HPI.  Patient Active Problem List   Diagnosis Date Noted  . Anxiety 06/14/2018  . Allergic rhinitis 12/14/2017  . Obesity (BMI 30.0-34.9) 10/02/2016  . B12 deficiency 10/02/2016  . Hyperlipidemia 10/02/2016    Social History   Tobacco Use  . Smoking status: Former Smoker    Types: Cigarettes    Last attempt to quit: 10/03/1991    Years since quitting: 26.7  . Smokeless tobacco: Never Used  Substance Use Topics  . Alcohol use: Yes    Current Outpatient Medications:  .  ALPRAZolam (XANAX) 0.5 MG tablet, 1/2-1 tab twice daily as needed for anxiety attacks, Disp: 30 tablet, Rfl: 0 .  Chlorpheniramine Maleate (ALLERGY PO), Take 1 tablet by mouth daily., Disp: , Rfl:  .   cyanocobalamin (,VITAMIN B-12,) 1000 MCG/ML injection, cyanocobalamin (vit B-12) 1,000 mcg/mL injection solution  INJECT 1 ML  Q 10 DAYS AS DIRECTED, Disp: 3 mL, Rfl: 6 .  cyclobenzaprine (FLEXERIL) 5 MG tablet, Take 1 tablet (5 mg total) by mouth at bedtime., Disp: 30 tablet, Rfl: 1 .  Norgestimate-Ethinyl Estradiol Triphasic (ORTHO TRI-CYCLEN LO) 0.18/0.215/0.25 MG-25 MCG tab, Take 1 tablet by mouth daily., Disp: , Rfl:  .  sertraline (ZOLOFT) 25 MG tablet, Take 1 tablet (25 mg total) by mouth daily., Disp: 30 tablet, Rfl: 3 .  SYRINGE-NEEDLE, DISP, 3 ML 25G X 1" 3 ML MISC, 1 Syringe by Does not apply route every 30 (thirty) days., Disp: 6 each, Rfl: 0  No Known Allergies  Objective:   There were no vitals taken for this visit. AAOx3, NAD NCAT, EOMI No obvious CN deficits Coloring WNL Pt is able to speak clearly, coherently without shortness of breath or increased work of breathing.  Thought process is linear.  Mood is appropriate.   Assessment and Plan:   Anxiety- somewhat improved since starting Zoloft.  Will increase to 50mg  daily and continue to monitor for improvement.  Continue Alprazolam prn.  Given her complicated and toxic relationship w/ mother, will refer for counseling.  Pt at first was hesitant but now in agreement.  Discussed Calm and HeadSpace w/ pt.  Total time spent 31 minutes, >50% spent counseling  TMJ- ongoing.  Unable to take Flexeril due to excessive sedation.  Switch to Robaxin.  Pt expressed understanding and is in agreement w/ plan.    Neena RhymesKatherine Alwyn Cordner, MD 07/01/2018

## 2018-07-01 NOTE — Progress Notes (Signed)
I have discussed the procedure for the virtual visit with the patient who has given consent to proceed with assessment and treatment.   BETHANY DILLARD, CMA     

## 2018-07-17 ENCOUNTER — Ambulatory Visit (INDEPENDENT_AMBULATORY_CARE_PROVIDER_SITE_OTHER): Payer: 59 | Admitting: Psychology

## 2018-07-17 DIAGNOSIS — F410 Panic disorder [episodic paroxysmal anxiety] without agoraphobia: Secondary | ICD-10-CM | POA: Diagnosis not present

## 2018-07-23 ENCOUNTER — Ambulatory Visit (INDEPENDENT_AMBULATORY_CARE_PROVIDER_SITE_OTHER): Payer: 59 | Admitting: Psychology

## 2018-07-23 DIAGNOSIS — F410 Panic disorder [episodic paroxysmal anxiety] without agoraphobia: Secondary | ICD-10-CM

## 2018-07-31 ENCOUNTER — Encounter: Payer: Self-pay | Admitting: Family Medicine

## 2018-07-31 ENCOUNTER — Other Ambulatory Visit: Payer: Self-pay

## 2018-07-31 ENCOUNTER — Ambulatory Visit (INDEPENDENT_AMBULATORY_CARE_PROVIDER_SITE_OTHER): Payer: 59 | Admitting: Family Medicine

## 2018-07-31 VITALS — Temp 98.4°F | Ht 66.0 in | Wt 203.0 lb

## 2018-07-31 DIAGNOSIS — F419 Anxiety disorder, unspecified: Secondary | ICD-10-CM

## 2018-07-31 MED ORDER — ALPRAZOLAM 0.5 MG PO TABS: ORAL_TABLET | ORAL | 3 refills | Status: DC

## 2018-07-31 NOTE — Progress Notes (Signed)
I have discussed the procedure for the virtual visit with the patient who has given consent to proceed with assessment and treatment.   Jessica L Brodmerkel, CMA     

## 2018-07-31 NOTE — Progress Notes (Signed)
   Virtual Visit via Video   I connected with patient on 07/31/18 at  8:20 AM EDT by a video enabled telemedicine application and verified that I am speaking with the correct person using two identifiers.  Location patient: Home Location provider: [facility], Office Persons participating in the virtual visit: Patient, Provider, CMA (Jess B)  I discussed the limitations of evaluation and management by telemedicine and the availability of in person appointments. The patient expressed understanding and agreed to proceed.  Subjective:   HPI:   Anxiety- at last visit sertraline was increased to 50mg  daily.  'I think it's definitely better'.  'both medications help A LOT and the counselor is VERY helpful'.  Is seeing the counselor weekly Elisha Ponder).  Pt finds that she is taking the Alprazolam 'maybe 2-3x/week'.  Pt is less tearful.  ROS:   See pertinent positives and negatives per HPI.  Patient Active Problem List   Diagnosis Date Noted  . Anxiety 06/14/2018  . Allergic rhinitis 12/14/2017  . Obesity (BMI 30.0-34.9) 10/02/2016  . B12 deficiency 10/02/2016  . Hyperlipidemia 10/02/2016    Social History   Tobacco Use  . Smoking status: Former Smoker    Types: Cigarettes    Last attempt to quit: 10/03/1991    Years since quitting: 26.8  . Smokeless tobacco: Never Used  Substance Use Topics  . Alcohol use: Yes    Current Outpatient Medications:  .  ALPRAZolam (XANAX) 0.5 MG tablet, 1/2-1 tab twice daily as needed for anxiety attacks, Disp: 30 tablet, Rfl: 0 .  Chlorpheniramine Maleate (ALLERGY PO), Take 1 tablet by mouth daily., Disp: , Rfl:  .  cyanocobalamin (,VITAMIN B-12,) 1000 MCG/ML injection, cyanocobalamin (vit B-12) 1,000 mcg/mL injection solution  INJECT 1 ML  Q 10 DAYS AS DIRECTED, Disp: 3 mL, Rfl: 6 .  cyclobenzaprine (FLEXERIL) 5 MG tablet, Take 1 tablet (5 mg total) by mouth at bedtime., Disp: 30 tablet, Rfl: 1 .  methocarbamol (ROBAXIN) 500 MG tablet,  Take 1 tablet (500 mg total) by mouth every 8 (eight) hours as needed for muscle spasms., Disp: 30 tablet, Rfl: 1 .  Norgestimate-Ethinyl Estradiol Triphasic (ORTHO TRI-CYCLEN LO) 0.18/0.215/0.25 MG-25 MCG tab, Take 1 tablet by mouth daily., Disp: , Rfl:  .  sertraline (ZOLOFT) 50 MG tablet, Take 1 tablet (50 mg total) by mouth daily., Disp: 30 tablet, Rfl: 3 .  SYRINGE-NEEDLE, DISP, 3 ML 25G X 1" 3 ML MISC, 1 Syringe by Does not apply route every 30 (thirty) days., Disp: 6 each, Rfl: 0  No Known Allergies  Objective:   Temp 98.4 F (36.9 C) (Oral)   Ht 5\' 6"  (1.676 m)   Wt 203 lb (92.1 kg)   BMI 32.77 kg/m  AAOx3, NAD NCAT, EOMI No obvious CN deficits Coloring WNL Pt is able to speak clearly, coherently without shortness of breath or increased work of breathing.  Thought process is linear.  Mood is appropriate.   Assessment and Plan:   Anxiety/depression- improved w/ addition of counseling and increased dose of Sertraline.  She continues to have some anxiety but overall feels things are much better.  No med changes at this time.  Will continue to follow.   Neena Rhymes, MD 07/31/2018

## 2018-08-02 ENCOUNTER — Ambulatory Visit (INDEPENDENT_AMBULATORY_CARE_PROVIDER_SITE_OTHER): Payer: 59 | Admitting: Psychology

## 2018-08-02 DIAGNOSIS — F410 Panic disorder [episodic paroxysmal anxiety] without agoraphobia: Secondary | ICD-10-CM

## 2018-08-07 ENCOUNTER — Other Ambulatory Visit: Payer: Self-pay | Admitting: General Practice

## 2018-08-07 MED ORDER — CYCLOBENZAPRINE HCL 5 MG PO TABS: 5.0000 mg | ORAL_TABLET | Freq: Every day | ORAL | 1 refills | Status: DC

## 2018-08-07 NOTE — Telephone Encounter (Signed)
Last OV 07/31/18 Flexeril last filled 06/14/18 #30 with 1

## 2018-08-09 ENCOUNTER — Ambulatory Visit: Payer: 59 | Admitting: Psychology

## 2018-10-10 IMAGING — US ULTRASOUND LEFT BREAST LIMITED
1 series · 4 of 4 positions shown · non-contrast
Comparison: Mammography 05/11/2016, 04/27/2016, 78,016 and earlier.

CLINICAL DATA: Six-month interval follow-up of a likely benign left
breast mass, likely complex cyst, in the upper outer quadrant.

EXAM:
2D DIGITAL DIAGNOSTIC LEFT MAMMOGRAM WITH CAD AND ADJUNCT TOMO
ULTRASOUND LEFT BREAST

[Series 1: ultrasound left breast limited · 0.06mm/px · 4 of 4 slices shown]
[im 1/4]
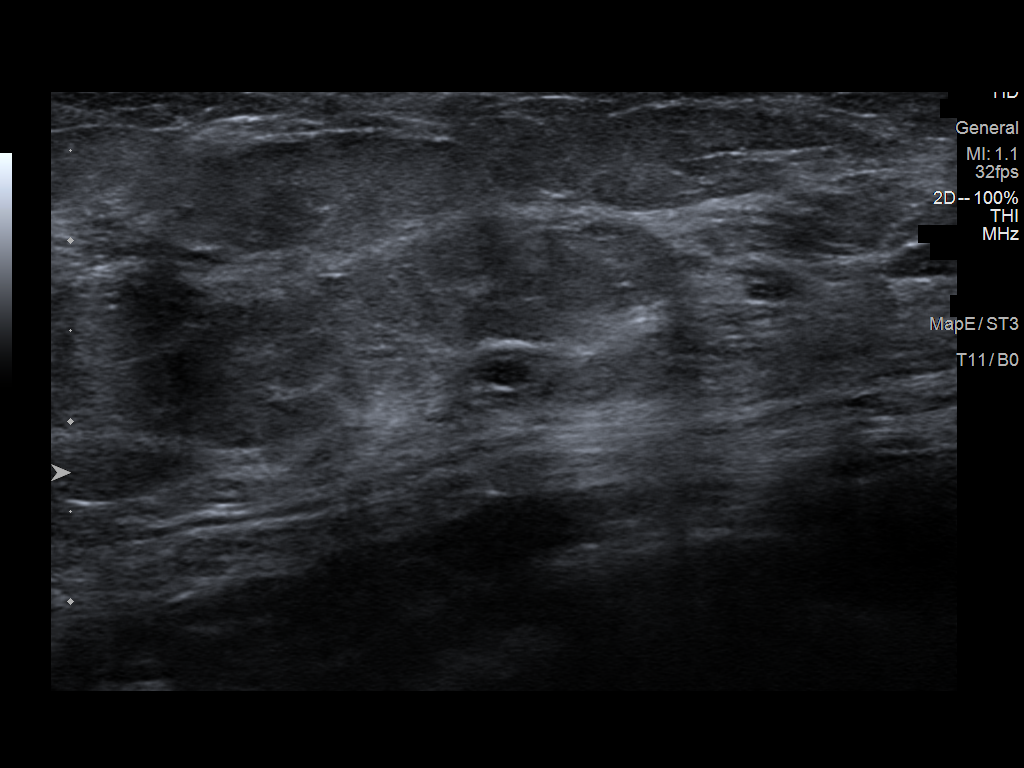
[im 2/4]
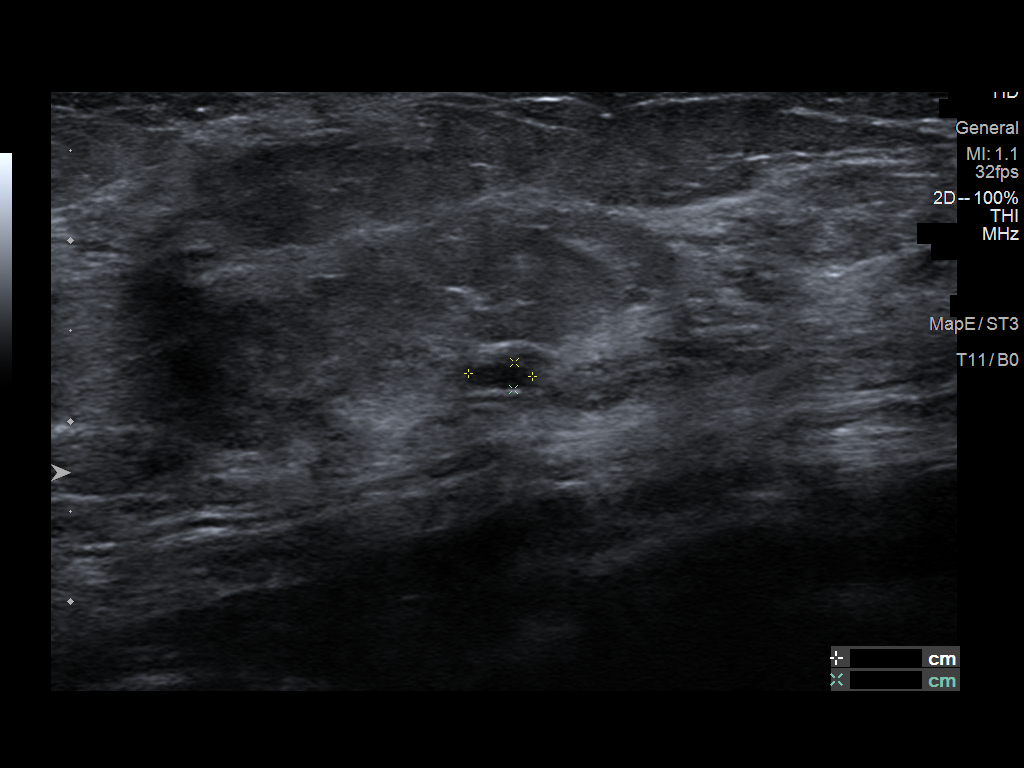
[im 3/4]
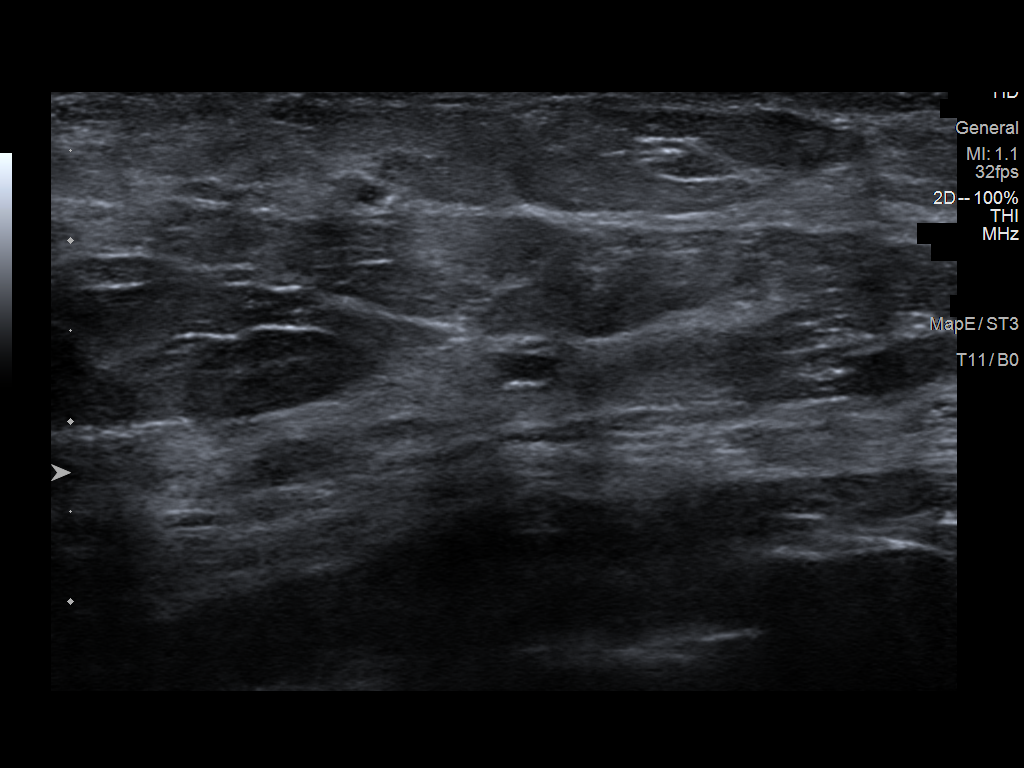
[im 4/4]
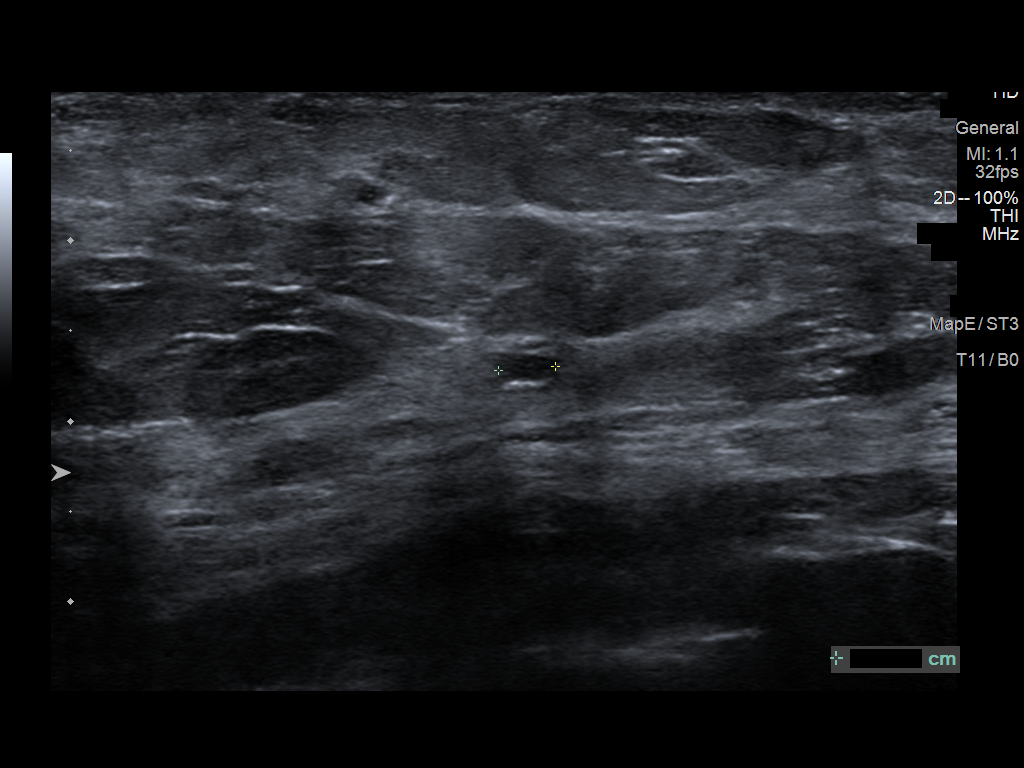

[4 of 4 positions shown; findings below may reference images not displayed]

Left breast ultrasound 05/11/2016.

ACR Breast Density Category c: The breast tissue is heterogeneously
dense, which may obscure small masses.
FINDINGS: Standard 2D and tomosynthesis full field CC and MLO views of the
left breast were obtained.

The circumscribed low-density mass in the upper outer left breast,
best seen on CC tomosynthesis images, has decreased in size since
the prior mammogram. No new or suspicious findings in the left
breast.

Mammographic images were processed with CAD.

Targeted left breast ultrasound is performed, showing that the
previously identified bilobed parallel hypoechoic mass at the 2:30
o'clock position approximately 3 cm from the nipple has decreased in
size, currently measuring approximately 4 x 2 x 3 mm (previously 6 x
4 x 7 mm). No new suspicious solid mass or abnormal acoustic
shadowing is identified.
IMPRESSION: 1. Interval decrease in size of the benign mildly complex cyst in
the upper outer quadrant of the left breast.
2. No mammographic or sonographic evidence of malignancy involving
the left breast.

RECOMMENDATION:
Bilateral screening mammography which is due in April 2017.

I have discussed the findings and recommendations with the patient.
Results were also provided in writing at the conclusion of the
visit. If applicable, a reminder letter will be sent to the patient
regarding the next appointment.

BI-RADS CATEGORY  2: Benign.

## 2018-10-22 ENCOUNTER — Ambulatory Visit (INDEPENDENT_AMBULATORY_CARE_PROVIDER_SITE_OTHER): Payer: 59 | Admitting: Psychology

## 2018-10-22 DIAGNOSIS — F410 Panic disorder [episodic paroxysmal anxiety] without agoraphobia: Secondary | ICD-10-CM | POA: Diagnosis not present

## 2018-11-07 ENCOUNTER — Ambulatory Visit (INDEPENDENT_AMBULATORY_CARE_PROVIDER_SITE_OTHER): Payer: 59 | Admitting: Psychology

## 2018-11-07 DIAGNOSIS — F410 Panic disorder [episodic paroxysmal anxiety] without agoraphobia: Secondary | ICD-10-CM

## 2018-11-18 ENCOUNTER — Encounter: Payer: Self-pay | Admitting: Family Medicine

## 2018-11-18 NOTE — Telephone Encounter (Signed)
Please advise 

## 2018-11-20 MED ORDER — SERTRALINE HCL 50 MG PO TABS: 50.0000 mg | ORAL_TABLET | Freq: Every day | ORAL | 3 refills | Status: DC

## 2018-11-20 MED ORDER — METHOCARBAMOL 500 MG PO TABS: 500.0000 mg | ORAL_TABLET | Freq: Three times a day (TID) | ORAL | 1 refills | Status: DC | PRN

## 2018-11-20 MED ORDER — ALPRAZOLAM 0.5 MG PO TABS: ORAL_TABLET | ORAL | 3 refills | Status: DC

## 2018-11-20 NOTE — Telephone Encounter (Signed)
The Contrave was ordered in July 2018 and will not be sent in at this time.  We need to have a visit first to discuss.  The other medications will be refilled as requested

## 2018-11-21 ENCOUNTER — Ambulatory Visit: Payer: 59 | Admitting: Psychology

## 2018-12-05 ENCOUNTER — Ambulatory Visit: Payer: 59 | Admitting: Psychology

## 2018-12-20 ENCOUNTER — Telehealth: Payer: Self-pay | Admitting: General Practice

## 2018-12-20 ENCOUNTER — Encounter: Payer: Self-pay | Admitting: Family Medicine

## 2018-12-20 ENCOUNTER — Other Ambulatory Visit: Payer: Self-pay

## 2018-12-20 ENCOUNTER — Ambulatory Visit (INDEPENDENT_AMBULATORY_CARE_PROVIDER_SITE_OTHER): Payer: BLUE CROSS/BLUE SHIELD | Admitting: Family Medicine

## 2018-12-20 VITALS — BP 112/78 | HR 77 | Temp 97.3°F | Resp 16 | Ht 66.0 in | Wt 212.0 lb

## 2018-12-20 DIAGNOSIS — E538 Deficiency of other specified B group vitamins: Secondary | ICD-10-CM

## 2018-12-20 DIAGNOSIS — Z1211 Encounter for screening for malignant neoplasm of colon: Secondary | ICD-10-CM

## 2018-12-20 DIAGNOSIS — Z0000 Encounter for general adult medical examination without abnormal findings: Secondary | ICD-10-CM | POA: Insufficient documentation

## 2018-12-20 DIAGNOSIS — E785 Hyperlipidemia, unspecified: Secondary | ICD-10-CM | POA: Diagnosis not present

## 2018-12-20 DIAGNOSIS — E669 Obesity, unspecified: Secondary | ICD-10-CM | POA: Diagnosis not present

## 2018-12-20 LAB — BASIC METABOLIC PANEL
BUN: 12 mg/dL (ref 6–23)
CO2: 27 mEq/L (ref 19–32)
Calcium: 9.5 mg/dL (ref 8.4–10.5)
Chloride: 102 mEq/L (ref 96–112)
Creatinine, Ser: 0.78 mg/dL (ref 0.40–1.20)
GFR: 77.73 mL/min (ref >60.00)
Glucose, Bld: 77 mg/dL (ref 70–99)
Potassium: 4.5 mEq/L (ref 3.5–5.1)
Sodium: 139 mEq/L (ref 135–145)

## 2018-12-20 LAB — CBC WITH DIFFERENTIAL/PLATELET
Basophils Absolute: 0 10*3/uL (ref 0.0–0.1)
Basophils Relative: 0.6 % (ref 0.0–3.0)
Eosinophils Absolute: 0.2 10*3/uL (ref 0.0–0.7)
Eosinophils Relative: 2.7 % (ref 0.0–5.0)
HCT: 41 % (ref 36.0–46.0)
Hemoglobin: 13.6 g/dL (ref 12.0–15.0)
Lymphocytes Relative: 26.8 % (ref 12.0–46.0)
Lymphs Abs: 2.4 10*3/uL (ref 0.7–4.0)
MCHC: 33.2 g/dL (ref 30.0–36.0)
MCV: 86.2 fl (ref 78.0–100.0)
Monocytes Absolute: 0.5 10*3/uL (ref 0.1–1.0)
Monocytes Relative: 5.2 % (ref 3.0–12.0)
Neutro Abs: 5.7 10*3/uL (ref 1.4–7.7)
Neutrophils Relative %: 64.7 % (ref 43.0–77.0)
Platelets: 295 10*3/uL (ref 150.0–400.0)
RBC: 4.76 Mil/uL (ref 3.87–5.11)
RDW: 14.5 % (ref 11.5–15.5)
WBC: 8.8 10*3/uL (ref 4.0–10.5)

## 2018-12-20 LAB — VITAMIN B12: Vitamin B-12: 349 pg/mL (ref 211–911)

## 2018-12-20 LAB — LIPID PANEL
Cholesterol: 187 mg/dL (ref 0–200)
HDL: 44.5 mg/dL (ref >39.00)
NonHDL: 142.45
Total CHOL/HDL Ratio: 4
Triglycerides: 256 mg/dL — ABNORMAL HIGH (ref 0.0–149.0)
VLDL: 51.2 mg/dL — ABNORMAL HIGH (ref 0.0–40.0)

## 2018-12-20 LAB — LDL CHOLESTEROL, DIRECT: Direct LDL: 118 mg/dL

## 2018-12-20 LAB — HEPATIC FUNCTION PANEL
ALT: 9 U/L (ref 0–35)
AST: 9 U/L (ref 0–37)
Albumin: 3.8 g/dL (ref 3.5–5.2)
Alkaline Phosphatase: 72 U/L (ref 39–117)
Bilirubin, Direct: 0.1 mg/dL (ref 0.0–0.3)
Total Bilirubin: 0.3 mg/dL (ref 0.2–1.2)
Total Protein: 6.3 g/dL (ref 6.0–8.3)

## 2018-12-20 LAB — TSH: TSH: 3.57 u[IU]/mL (ref 0.35–4.50)

## 2018-12-20 MED ORDER — NALTREXONE-BUPROPION HCL ER 8-90 MG PO TB12: ORAL_TABLET | ORAL | 3 refills | Status: DC

## 2018-12-20 NOTE — Patient Instructions (Addendum)
Follow up in 3-4 months to recheck weight loss progress We'll notify you of your lab results and make any changes if needed Continue to work on healthy diet and regular exercise- you can do it! START the Contrave as directed Call and reschedule your mammogram Complete the cologuard and return as directed Call with any questions or concerns Stay Safe!!!

## 2018-12-20 NOTE — Assessment & Plan Note (Signed)
Deteriorated.  Pt has gained 9 lbs since last visit.  Admits to poor diet and no exercise.  She wants to try Contrave.  Discussed that this needs to be done in addition to diet and exercise and not in place of.  Check labs to risk stratify.  Will follow.

## 2018-12-20 NOTE — Assessment & Plan Note (Signed)
Chronic problem.  Attempting to control w/ diet and exercise but pt admits to doing poorly on both.  Check labs and determine if medication is needed.

## 2018-12-20 NOTE — Assessment & Plan Note (Signed)
Pt's PE WNL w/ exception of obesity.  UTD on Tdap.  Declines flu.  Pt to reschedule mammo.  Will do cologuard rather than colonscopy.  Check labs.  Anticipatory guidance provided.

## 2018-12-20 NOTE — Telephone Encounter (Signed)
PA for contrave began in covermymeds today.   

## 2018-12-20 NOTE — Progress Notes (Signed)
   Subjective:    Patient ID: Jacqueline Mcfarland, female    DOB: 10-07-1965, 53 y.o.   MRN: [id number]  HPI CPE- UTD on pap, due for mammo (was scheduled but cancelled due to [place]).  Declines flu.  UTD on Tdap.  Due for colonoscopy, mammo.  Pt prefers cologuard.  Pt has gained 9 lbs.  Would like to revisit Contrave.  Admits to no exercise.  Increased snacking.   Review of Systems Patient reports no vision/ hearing changes, adenopathy,fever,  persistant/recurrent hoarseness , swallowing issues, chest pain, palpitations, edema, persistant/recurrent cough, hemoptysis, dyspnea (rest/exertional/paroxysmal nocturnal), gastrointestinal bleeding (melena, rectal bleeding), abdominal pain, significant heartburn, bowel changes, GU symptoms (dysuria, hematuria, incontinence), Gyn symptoms (abnormal  bleeding, pain),  syncope, focal weakness, memory loss, numbness & tingling, skin/hair/nail changes, abnormal bruising or bleeding, anxiety, or depression.     Objective:   Physical Exam General Appearance:    Alert, cooperative, no distress, appears stated age  Head:    Normocephalic, without obvious abnormality, atraumatic  Eyes:    PERRL, conjunctiva/corneas clear, EOM's intact, fundi    benign, both eyes  Ears:    Normal TM's and external ear canals, both ears  Nose:   Deferred due to COVID  Throat:   Neck:   Supple, symmetrical, trachea midline, no adenopathy;    Thyroid: no enlargement/tenderness/nodules  Back:     Symmetric, no curvature, ROM normal, no CVA tenderness  Lungs:     Clear to auscultation bilaterally, respirations unlabored  Chest Wall:    No tenderness or deformity   Heart:    Regular rate and rhythm, S1 and S2 normal, no murmur, rub   or gallop  Breast Exam:    Deferred to GYN  Abdomen:     Soft, non-tender, bowel sounds active all four quadrants,    no masses, no organomegaly  Genitalia:    Deferred to GYN  Rectal:    Extremities:   Extremities normal, atraumatic, no cyanosis or edema   Pulses:   2+ and symmetric all extremities  Skin:   Skin color, texture, turgor normal, no rashes or lesions  Lymph nodes:   Cervical, supraclavicular, and axillary nodes normal  Neurologic:   CNII-XII intact, normal strength, sensation and reflexes    throughout          Assessment & Plan:

## 2018-12-20 NOTE — Assessment & Plan Note (Signed)
Chronic problem.  Pt is due for repeat labs.  Replete prn.

## 2019-01-06 ENCOUNTER — Encounter: Payer: Self-pay | Admitting: Family Medicine

## 2019-01-14 ENCOUNTER — Other Ambulatory Visit: Payer: Self-pay | Admitting: General Practice

## 2019-01-14 MED ORDER — METHOCARBAMOL 500 MG PO TABS: 500.0000 mg | ORAL_TABLET | Freq: Three times a day (TID) | ORAL | 1 refills | Status: DC | PRN

## 2019-01-18 LAB — COLOGUARD: Cologuard: NEGATIVE

## 2019-01-24 DIAGNOSIS — Z1211 Encounter for screening for malignant neoplasm of colon: Secondary | ICD-10-CM | POA: Diagnosis not present

## 2019-02-06 ENCOUNTER — Other Ambulatory Visit: Payer: Self-pay | Admitting: General Practice

## 2019-02-06 NOTE — Telephone Encounter (Signed)
Please advise per last Lab B12 was normal. Received a refill request for this today.

## 2019-02-07 MED ORDER — CYANOCOBALAMIN 1000 MCG/ML IJ SOLN: INTRAMUSCULAR | 6 refills | Status: DC

## 2019-02-19 ENCOUNTER — Encounter: Payer: Self-pay | Admitting: General Practice

## 2019-02-19 ENCOUNTER — Encounter: Payer: Self-pay | Admitting: Family Medicine

## 2019-03-10 ENCOUNTER — Other Ambulatory Visit: Payer: Self-pay | Admitting: General Practice

## 2019-03-10 MED ORDER — SERTRALINE HCL 50 MG PO TABS: 50.0000 mg | ORAL_TABLET | Freq: Every day | ORAL | 3 refills | Status: DC

## 2019-03-28 ENCOUNTER — Ambulatory Visit: Payer: Self-pay | Admitting: Family Medicine

## 2019-05-13 DIAGNOSIS — M26632 Articular disc disorder of left temporomandibular joint: Secondary | ICD-10-CM | POA: Diagnosis not present

## 2019-05-21 DIAGNOSIS — Z6834 Body mass index (BMI) 34.0-34.9, adult: Secondary | ICD-10-CM | POA: Diagnosis not present

## 2019-05-21 DIAGNOSIS — Z01419 Encounter for gynecological examination (general) (routine) without abnormal findings: Secondary | ICD-10-CM | POA: Diagnosis not present

## 2019-05-21 DIAGNOSIS — Z124 Encounter for screening for malignant neoplasm of cervix: Secondary | ICD-10-CM | POA: Diagnosis not present

## 2019-05-21 LAB — HM MAMMOGRAPHY: HM Mammogram: NORMAL (ref 0–4)

## 2019-05-21 LAB — HM PAP SMEAR

## 2019-05-30 ENCOUNTER — Encounter: Payer: Self-pay | Admitting: General Practice

## 2019-06-26 DIAGNOSIS — Z1231 Encounter for screening mammogram for malignant neoplasm of breast: Secondary | ICD-10-CM | POA: Diagnosis not present

## 2019-08-06 ENCOUNTER — Other Ambulatory Visit: Payer: Self-pay | Admitting: General Practice

## 2019-08-06 MED ORDER — SERTRALINE HCL 50 MG PO TABS: 50.0000 mg | ORAL_TABLET | Freq: Every day | ORAL | 3 refills | Status: DC

## 2019-08-27 ENCOUNTER — Encounter: Payer: Self-pay | Admitting: Family Medicine

## 2019-08-28 MED ORDER — CYANOCOBALAMIN 1000 MCG/ML IJ SOLN: INTRAMUSCULAR | 6 refills | Status: DC

## 2019-08-28 MED ORDER — SERTRALINE HCL 50 MG PO TABS: 50.0000 mg | ORAL_TABLET | Freq: Every day | ORAL | 3 refills | Status: DC

## 2019-08-28 MED ORDER — "SYRINGE/NEEDLE (DISP) 25G X 1"" 3 ML MISC": 1.0000 | 0 refills | Status: DC

## 2019-11-22 DIAGNOSIS — R519 Headache, unspecified: Secondary | ICD-10-CM | POA: Diagnosis not present

## 2019-11-22 DIAGNOSIS — D510 Vitamin B12 deficiency anemia due to intrinsic factor deficiency: Secondary | ICD-10-CM | POA: Diagnosis not present

## 2019-11-22 DIAGNOSIS — K828 Other specified diseases of gallbladder: Secondary | ICD-10-CM | POA: Diagnosis not present

## 2019-11-22 DIAGNOSIS — Z20822 Contact with and (suspected) exposure to covid-19: Secondary | ICD-10-CM | POA: Diagnosis not present

## 2019-11-22 DIAGNOSIS — R1011 Right upper quadrant pain: Secondary | ICD-10-CM | POA: Diagnosis not present

## 2019-11-22 DIAGNOSIS — Z6834 Body mass index (BMI) 34.0-34.9, adult: Secondary | ICD-10-CM | POA: Diagnosis not present

## 2019-11-22 DIAGNOSIS — E669 Obesity, unspecified: Secondary | ICD-10-CM | POA: Diagnosis not present

## 2019-11-22 DIAGNOSIS — F419 Anxiety disorder, unspecified: Secondary | ICD-10-CM | POA: Diagnosis not present

## 2019-11-22 DIAGNOSIS — K810 Acute cholecystitis: Secondary | ICD-10-CM | POA: Diagnosis not present

## 2019-11-22 DIAGNOSIS — R112 Nausea with vomiting, unspecified: Secondary | ICD-10-CM | POA: Diagnosis not present

## 2019-11-22 DIAGNOSIS — Z87891 Personal history of nicotine dependence: Secondary | ICD-10-CM | POA: Diagnosis not present

## 2019-11-22 DIAGNOSIS — K8012 Calculus of gallbladder with acute and chronic cholecystitis without obstruction: Secondary | ICD-10-CM | POA: Diagnosis not present

## 2019-11-22 DIAGNOSIS — R1031 Right lower quadrant pain: Secondary | ICD-10-CM | POA: Diagnosis not present

## 2019-11-22 DIAGNOSIS — Z79899 Other long term (current) drug therapy: Secondary | ICD-10-CM | POA: Diagnosis not present

## 2019-11-23 DIAGNOSIS — K810 Acute cholecystitis: Secondary | ICD-10-CM | POA: Insufficient documentation

## 2019-11-23 DIAGNOSIS — K660 Peritoneal adhesions (postprocedural) (postinfection): Secondary | ICD-10-CM | POA: Diagnosis not present

## 2019-11-23 DIAGNOSIS — K828 Other specified diseases of gallbladder: Secondary | ICD-10-CM | POA: Diagnosis not present

## 2019-11-23 DIAGNOSIS — K812 Acute cholecystitis with chronic cholecystitis: Secondary | ICD-10-CM | POA: Diagnosis not present

## 2019-11-23 DIAGNOSIS — K8012 Calculus of gallbladder with acute and chronic cholecystitis without obstruction: Secondary | ICD-10-CM | POA: Diagnosis not present

## 2019-11-23 DIAGNOSIS — R1031 Right lower quadrant pain: Secondary | ICD-10-CM | POA: Diagnosis not present

## 2019-11-24 HISTORY — PX: CHOLECYSTECTOMY, LAPAROSCOPIC: SHX56

## 2019-12-17 ENCOUNTER — Other Ambulatory Visit: Payer: Self-pay | Admitting: General Practice

## 2019-12-17 MED ORDER — SERTRALINE HCL 50 MG PO TABS: 50.0000 mg | ORAL_TABLET | Freq: Every day | ORAL | 0 refills | Status: DC

## 2020-01-20 ENCOUNTER — Encounter: Payer: Self-pay | Admitting: Family Medicine

## 2020-01-20 ENCOUNTER — Other Ambulatory Visit: Payer: Self-pay

## 2020-01-20 ENCOUNTER — Ambulatory Visit (INDEPENDENT_AMBULATORY_CARE_PROVIDER_SITE_OTHER): Payer: BC Managed Care – PPO | Admitting: Family Medicine

## 2020-01-20 VITALS — BP 115/80 | HR 77 | Temp 97.5°F | Resp 17 | Ht 66.0 in | Wt 215.0 lb

## 2020-01-20 DIAGNOSIS — E669 Obesity, unspecified: Secondary | ICD-10-CM | POA: Diagnosis not present

## 2020-01-20 DIAGNOSIS — Z0001 Encounter for general adult medical examination with abnormal findings: Secondary | ICD-10-CM

## 2020-01-20 DIAGNOSIS — M79602 Pain in left arm: Secondary | ICD-10-CM | POA: Diagnosis not present

## 2020-01-20 DIAGNOSIS — Z1159 Encounter for screening for other viral diseases: Secondary | ICD-10-CM

## 2020-01-20 DIAGNOSIS — E538 Deficiency of other specified B group vitamins: Secondary | ICD-10-CM | POA: Diagnosis not present

## 2020-01-20 DIAGNOSIS — Z114 Encounter for screening for human immunodeficiency virus [HIV]: Secondary | ICD-10-CM | POA: Diagnosis not present

## 2020-01-20 DIAGNOSIS — K219 Gastro-esophageal reflux disease without esophagitis: Secondary | ICD-10-CM

## 2020-01-20 DIAGNOSIS — E66811 Obesity, class 1: Secondary | ICD-10-CM

## 2020-01-20 DIAGNOSIS — Z0000 Encounter for general adult medical examination without abnormal findings: Secondary | ICD-10-CM

## 2020-01-20 LAB — CBC WITH DIFFERENTIAL/PLATELET
Basophils Absolute: 0.2 10*3/uL — ABNORMAL HIGH (ref 0.0–0.1)
Basophils Relative: 2.1 % (ref 0.0–3.0)
Eosinophils Absolute: 0.3 10*3/uL (ref 0.0–0.7)
Eosinophils Relative: 3.3 % (ref 0.0–5.0)
HCT: 42.8 % (ref 36.0–46.0)
Hemoglobin: 14 g/dL (ref 12.0–15.0)
Lymphocytes Relative: 27.2 % (ref 12.0–46.0)
Lymphs Abs: 2.5 10*3/uL (ref 0.7–4.0)
MCHC: 32.7 g/dL (ref 30.0–36.0)
MCV: 85.8 fl (ref 78.0–100.0)
Monocytes Absolute: 0.5 10*3/uL (ref 0.1–1.0)
Monocytes Relative: 5.1 % (ref 3.0–12.0)
Neutro Abs: 5.7 10*3/uL (ref 1.4–7.7)
Neutrophils Relative %: 62.3 % (ref 43.0–77.0)
Platelets: 293 10*3/uL (ref 150.0–400.0)
RBC: 4.99 Mil/uL (ref 3.87–5.11)
RDW: 15.1 % (ref 11.5–15.5)
WBC: 9.1 10*3/uL (ref 4.0–10.5)

## 2020-01-20 LAB — BASIC METABOLIC PANEL
BUN: 11 mg/dL (ref 6–23)
CO2: 28 mEq/L (ref 19–32)
Calcium: 9 mg/dL (ref 8.4–10.5)
Chloride: 99 mEq/L (ref 96–112)
Creatinine, Ser: 0.83 mg/dL (ref 0.40–1.20)
GFR: 81.01 mL/min (ref >60.00)
Glucose, Bld: 77 mg/dL (ref 70–99)
Potassium: 4.4 mEq/L (ref 3.5–5.1)
Sodium: 137 mEq/L (ref 135–145)

## 2020-01-20 LAB — LIPID PANEL
Cholesterol: 198 mg/dL (ref 0–200)
HDL: 37.2 mg/dL — ABNORMAL LOW (ref >39.00)
NonHDL: 161.13
Total CHOL/HDL Ratio: 5
Triglycerides: 288 mg/dL — ABNORMAL HIGH (ref 0.0–149.0)
VLDL: 57.6 mg/dL — ABNORMAL HIGH (ref 0.0–40.0)

## 2020-01-20 LAB — HEPATIC FUNCTION PANEL
ALT: 12 U/L (ref 0–35)
AST: 11 U/L (ref 0–37)
Albumin: 3.9 g/dL (ref 3.5–5.2)
Alkaline Phosphatase: 60 U/L (ref 39–117)
Bilirubin, Direct: 0.1 mg/dL (ref 0.0–0.3)
Total Bilirubin: 0.4 mg/dL (ref 0.2–1.2)
Total Protein: 6.3 g/dL (ref 6.0–8.3)

## 2020-01-20 LAB — LDL CHOLESTEROL, DIRECT: Direct LDL: 137 mg/dL

## 2020-01-20 LAB — VITAMIN B12: Vitamin B-12: 916 pg/mL — ABNORMAL HIGH (ref 211–911)

## 2020-01-20 LAB — TSH: TSH: 3.43 u[IU]/mL (ref 0.35–4.50)

## 2020-01-20 LAB — VITAMIN D 25 HYDROXY (VIT D DEFICIENCY, FRACTURES): VITD: 19.92 ng/mL — ABNORMAL LOW (ref 30.00–100.00)

## 2020-01-20 MED ORDER — MELOXICAM 15 MG PO TABS: 15.0000 mg | ORAL_TABLET | Freq: Every day | ORAL | 0 refills | Status: DC

## 2020-01-20 MED ORDER — FAMOTIDINE 20 MG PO TABS: 20.0000 mg | ORAL_TABLET | Freq: Every day | ORAL | 3 refills | Status: DC

## 2020-01-20 NOTE — Assessment & Plan Note (Signed)
New.  Pt's sxs are waking her at night and she is having regurg.  Discussed H2 vs PPI.  Will start low dose H2 blocker and adjust as needed.  Pt expressed understanding and is in agreement w/ plan.

## 2020-01-20 NOTE — Assessment & Plan Note (Signed)
Pt has gained weight since last visit.  Again discussed importance of healthy diet and regular exercise.  Check labs to risk stratify.  Will follow.

## 2020-01-20 NOTE — Assessment & Plan Note (Signed)
Pt's PE WNL w/ exception of obesity.  UTD on pap, mammo, cologuard.  UTD on COVID, Tdap.  Declines flu.  Check labs.  Anticipatory guidance provided.

## 2020-01-20 NOTE — Patient Instructions (Signed)
Follow up in 6 months to recheck weight loss efforts We'll notify you of your lab results and make any changes if needed Continue to work on healthy diet and regular exercise- you can do it! START the Famotidine nightly to improve acid reflux USE the Meloxicam as needed for arm pain- take w/ food Call with any questions or concerns Stay Safe!  Stay Healthy! Happy Holidays!

## 2020-01-20 NOTE — Assessment & Plan Note (Signed)
Pt has hx of this.  Check labs and replete prn. 

## 2020-01-20 NOTE — Progress Notes (Signed)
   Subjective:    Patient ID: Jacqueline Mcfarland, female    DOB: September 23, 1965, 54 y.o.   MRN: [id number]  HPI CPE- UTD on pap, mammo, cologuard.  UTD on COVID.  Declines flu shot.  UTD on Tdap.  Reviewed past medical, surgical, family and social histories.   Patient Care Team    Relationship Specialty Notifications Start End  Sheliah Hatch, MD PCP - General Family Medicine  10/02/16   Carrington Clamp, MD Consulting Physician Obstetrics and Gynecology  12/20/18      Review of Systems Patient reports no vision/ hearing changes, adenopathy,fever, weight change,  persistant/recurrent hoarseness , swallowing issues, chest pain, palpitations, edema, persistant/recurrent cough, hemoptysis, dyspnea (rest/exertional/paroxysmal nocturnal), gastrointestinal bleeding (melena, rectal bleeding), abdominal pain, bowel changes, GU symptoms (dysuria, hematuria, incontinence), Gyn symptoms (abnormal  bleeding, pain),  syncope, focal weakness, memory loss, numbness & tingling, skin/hair/nail changes, abnormal bruising or bleeding, anxiety, or depression.   L arm pain- pain most notable w/ reaching behind her.  Would like to restart Meloxicam to see if she gets symptom relief. GERD- pt reports increased acid reflux.  sxs are waking her at night, will have regurg.  This visit occurred during the SARS-CoV-2 public health emergency.  Safety protocols were in place, including screening questions prior to the visit, additional usage of staff PPE, and extensive cleaning of exam room while observing appropriate contact time as indicated for disinfecting solutions.       Objective:   Physical Exam General Appearance:    Alert, cooperative, no distress, appears stated age, obese  Head:    Normocephalic, without obvious abnormality, atraumatic  Eyes:    PERRL, conjunctiva/corneas clear, EOM's intact, fundi    benign, both eyes  Ears:    Normal TM's and external ear canals, both ears  Nose:   Deferred due to COVID    Throat:   Neck:   Supple, symmetrical, trachea midline, no adenopathy;    Thyroid: no enlargement/tenderness/nodules  Back:     Symmetric, no curvature, ROM normal, no CVA tenderness  Lungs:     Clear to auscultation bilaterally, respirations unlabored  Chest Wall:    No tenderness or deformity   Heart:    Regular rate and rhythm, S1 and S2 normal, no murmur, rub   or gallop  Breast Exam:    Deferred to GYN  Abdomen:     Soft, non-tender, bowel sounds active all four quadrants,    no masses, no organomegaly  Genitalia:    Deferred to GYN  Rectal:    Extremities:   Extremities normal, atraumatic, no cyanosis or edema  Pulses:   2+ and symmetric all extremities  Skin:   Skin color, texture, turgor normal, no rashes or lesions  Lymph nodes:   Cervical, supraclavicular, and axillary nodes normal  Neurologic:   CNII-XII intact, normal strength, sensation and reflexes    throughout          Assessment & Plan:

## 2020-01-21 ENCOUNTER — Telehealth: Payer: Self-pay

## 2020-01-21 ENCOUNTER — Other Ambulatory Visit: Payer: Self-pay

## 2020-01-21 DIAGNOSIS — E559 Vitamin D deficiency, unspecified: Secondary | ICD-10-CM

## 2020-01-21 LAB — HIV ANTIBODY (ROUTINE TESTING W REFLEX): HIV 1&2 Ab, 4th Generation: NONREACTIVE

## 2020-01-21 LAB — HEPATITIS C ANTIBODY
Hepatitis C Ab: NONREACTIVE
SIGNAL TO CUT-OFF: 0.01 (ref <1.00)

## 2020-01-21 MED ORDER — VITAMIN D (ERGOCALCIFEROL) 1.25 MG (50000 UNIT) PO CAPS: 50000.0000 [IU] | ORAL_CAPSULE | ORAL | 0 refills | Status: DC

## 2020-01-21 NOTE — Telephone Encounter (Signed)
Spoke with patient about results. Patient voiced understanding. Vitamin D 50,000 units once a week for 12 weeks sent to pharmacy.

## 2020-02-25 ENCOUNTER — Encounter: Payer: Self-pay | Admitting: Family Medicine

## 2020-02-25 ENCOUNTER — Other Ambulatory Visit: Payer: Self-pay

## 2020-02-25 NOTE — Telephone Encounter (Signed)
Please ask Dr. Beverely Low to refill my Meloxicam prescription.  I think my arms are worse since I stopped taking it.  Thank you.  Merry Christmas to all if I don't speak to you before.   Jacqueline Mcfarland  LFD 01/20/20 #30 with no refills LOV 01/20/20 NOV none

## 2020-02-26 MED ORDER — MELOXICAM 15 MG PO TABS
15.0000 mg | ORAL_TABLET | Freq: Every day | ORAL | 0 refills | Status: DC
Start: 2020-02-26 — End: 2020-07-30

## 2020-04-13 ENCOUNTER — Encounter: Payer: Self-pay | Admitting: Family Medicine

## 2020-05-12 ENCOUNTER — Other Ambulatory Visit: Payer: Self-pay | Admitting: Family Medicine

## 2020-05-30 ENCOUNTER — Other Ambulatory Visit: Payer: Self-pay | Admitting: Family Medicine

## 2020-07-05 NOTE — Telephone Encounter (Signed)
I have sent an email to [facility].

## 2020-07-30 ENCOUNTER — Ambulatory Visit (INDEPENDENT_AMBULATORY_CARE_PROVIDER_SITE_OTHER): Payer: BC Managed Care – PPO | Admitting: Family Medicine

## 2020-07-30 ENCOUNTER — Encounter: Payer: Self-pay | Admitting: Family Medicine

## 2020-07-30 ENCOUNTER — Other Ambulatory Visit: Payer: Self-pay

## 2020-07-30 VITALS — BP 130/88 | HR 75 | Temp 97.5°F | Resp 17 | Ht 66.0 in | Wt 217.4 lb

## 2020-07-30 DIAGNOSIS — E669 Obesity, unspecified: Secondary | ICD-10-CM

## 2020-07-30 DIAGNOSIS — M797 Fibromyalgia: Secondary | ICD-10-CM | POA: Diagnosis not present

## 2020-07-30 DIAGNOSIS — M79601 Pain in right arm: Secondary | ICD-10-CM | POA: Diagnosis not present

## 2020-07-30 DIAGNOSIS — M79602 Pain in left arm: Secondary | ICD-10-CM

## 2020-07-30 DIAGNOSIS — F419 Anxiety disorder, unspecified: Secondary | ICD-10-CM

## 2020-07-30 DIAGNOSIS — I7300 Raynaud's syndrome without gangrene: Secondary | ICD-10-CM

## 2020-07-30 MED ORDER — ALPRAZOLAM 0.5 MG PO TABS: ORAL_TABLET | ORAL | 3 refills | Status: DC

## 2020-07-30 MED ORDER — PREGABALIN 75 MG PO CAPS: 75.0000 mg | ORAL_CAPSULE | Freq: Two times a day (BID) | ORAL | 3 refills | Status: DC

## 2020-07-30 MED ORDER — METHOCARBAMOL 500 MG PO TABS: 500.0000 mg | ORAL_TABLET | Freq: Three times a day (TID) | ORAL | 1 refills | Status: DC | PRN

## 2020-07-30 MED ORDER — SERTRALINE HCL 100 MG PO TABS: 100.0000 mg | ORAL_TABLET | Freq: Every day | ORAL | 3 refills | Status: DC

## 2020-07-30 MED ORDER — MELOXICAM 15 MG PO TABS: 15.0000 mg | ORAL_TABLET | Freq: Every day | ORAL | 0 refills | Status: DC

## 2020-07-30 NOTE — Progress Notes (Signed)
   Subjective:    Patient ID: Jacqueline Mcfarland, female    DOB: 12/06/1965, 55 y.o.   MRN: [id number]  HPI Bilateral arm pain- unable to lie on either side.  Pain with taking a shirt off.  Pt feels that most of her pain is in the biceps.  Was previously on Meloxicam w/ some relief.  Occasional neck pain.  States that after work, 'my whole body hurts'.  'night is so bad'. L>R.  Started on L 2 yrs ago and became bilateral 6-7 months ago.  Some improvement with heating pad, hot bath.  Pt was dx'd w/ Fibromyalgia in 1990- 'but I thought it was a crock'.  Pt reports that any type of bad news triggers a 'panic attack'- missing person, shootings, etc  'i'm totally consumed by it'  Weight gain- pt reports he has gained 20 lbs in the last year and 50 lbs since she got married in 2017.  Pt unable to afford Contrave.  Pt is not exercising regularly- prior to moving here was running 5 miles/day.  Sedentary job.  Tries to avoid eating late, doesn't eat breakfast, 'a regular lunch and regular dinner'.  Pt snacks throughout the day at her desk.  Pt previously lost 70 lbs by avoiding bread and sugar  Purple toes- occurs when she is cold.  Had frostbite in 2015.  Not painful.  No ulcers.  50 minutes   Review of Systems For ROS see HPI   This visit occurred during the SARS-CoV-2 public health emergency.  Safety protocols were in place, including screening questions prior to the visit, additional usage of staff PPE, and extensive cleaning of exam room while observing appropriate contact time as indicated for disinfecting solutions.       Objective:   Physical Exam Vitals reviewed.  Constitutional:      General: She is not in acute distress.    Appearance: Normal appearance. She is obese. She is not ill-appearing.  HENT:     Head: Normocephalic and atraumatic.  Eyes:     Extraocular Movements: Extraocular movements intact.     Conjunctiva/sclera: Conjunctivae normal.     Pupils: Pupils are equal, round, and  reactive to light.  Cardiovascular:     Rate and Rhythm: Normal rate and regular rhythm.     Pulses: Normal pulses.  Pulmonary:     Effort: Pulmonary effort is normal.     Breath sounds: Normal breath sounds.  Musculoskeletal:        General: Tenderness (TTP over nearly all fibro trigger/tender points) present.     Cervical back: Normal range of motion and neck supple.     Right lower leg: No edema.     Left lower leg: No edema.  Lymphadenopathy:     Cervical: No cervical adenopathy.  Skin:    General: Skin is warm and dry.     Findings: No bruising or rash.  Neurological:     General: No focal deficit present.     Mental Status: She is alert and oriented to person, place, and time.  Psychiatric:     Comments: anxious           Assessment & Plan:

## 2020-07-30 NOTE — Patient Instructions (Signed)
Follow up in 4-6 weeks to recheck mood and fibromyalgia We'll call you with your PT appt Increase your Zoloft to 100mg  daily- 2 of what you have at home and 1 of the new prescription Start the Lyrica twice daily for the fibromyalgia pain Please start Weight Watchers to work on weight loss Restart regular exercise- ease into it! Keep your feet warm and dress in layers Call with any questions or concerns Hang in there!!!

## 2020-08-10 DIAGNOSIS — I7300 Raynaud's syndrome without gangrene: Secondary | ICD-10-CM | POA: Insufficient documentation

## 2020-08-10 DIAGNOSIS — M797 Fibromyalgia: Secondary | ICD-10-CM | POA: Insufficient documentation

## 2020-08-10 NOTE — Assessment & Plan Note (Signed)
Deteriorated.  Had long discussion about the importance of low carb diet, regular exercise, and possibly joining a commercial program such as Weight Watchers for accountability.  Will follow closely.

## 2020-08-10 NOTE — Assessment & Plan Note (Signed)
New.  Pt has purple toes when she is cold but thankfully no ulcers and not painful.  Reviewed dx w/ pt and husband and discussed supportive care such as socks, throw blankets, etc

## 2020-08-10 NOTE — Assessment & Plan Note (Signed)
New.  Pt states she was dx'd w/ this back in 1990 but did not believe it.  Based on her reports of diffuse body pain, TTP over multiple trigger points, depression/anxiety, and insomnia she fits the clinical picture for Fibro.  Explained the interplay between mood, pain, and sleep and the improvement with regular exercise, weight loss, and stress management.  Will start Lyrica for the pain she is having and will refer to PT.  Total time spent w/ pt and husband explaining dx, reviewing tx plan, and counseling on weight loss/stress management 50 minutes.

## 2020-08-10 NOTE — Assessment & Plan Note (Signed)
Deteriorated.  Pt to increase sertraline to 100mg  daily.  Pt expressed understanding and is in agreement w/ plan.

## 2020-08-15 ENCOUNTER — Other Ambulatory Visit: Payer: Self-pay | Admitting: Family Medicine

## 2020-08-17 NOTE — Telephone Encounter (Signed)
LFD 07/30/20 #30 with 1 refill LOV 07/30/20 NOV 09/09/20

## 2020-08-31 ENCOUNTER — Other Ambulatory Visit: Payer: Self-pay | Admitting: Family Medicine

## 2020-09-01 ENCOUNTER — Other Ambulatory Visit: Payer: Self-pay | Admitting: Family Medicine

## 2020-09-01 NOTE — Telephone Encounter (Signed)
LFD 07/30/20 #30 with no refills LOV 07/30/20 NOV 09/09/20

## 2020-09-05 ENCOUNTER — Other Ambulatory Visit: Payer: Self-pay | Admitting: Family Medicine

## 2020-09-09 ENCOUNTER — Ambulatory Visit: Payer: BC Managed Care – PPO | Admitting: Family Medicine

## 2020-09-10 ENCOUNTER — Other Ambulatory Visit: Payer: Self-pay | Admitting: Family Medicine

## 2020-09-10 NOTE — Telephone Encounter (Signed)
LFD 08/18/20 #30 with 1 refill LOV 07/30/20 NOV none

## 2020-09-15 ENCOUNTER — Encounter: Payer: Self-pay | Admitting: *Deleted

## 2020-09-15 DIAGNOSIS — D1801 Hemangioma of skin and subcutaneous tissue: Secondary | ICD-10-CM | POA: Diagnosis not present

## 2020-09-15 DIAGNOSIS — L814 Other melanin hyperpigmentation: Secondary | ICD-10-CM | POA: Diagnosis not present

## 2020-09-15 DIAGNOSIS — L821 Other seborrheic keratosis: Secondary | ICD-10-CM | POA: Diagnosis not present

## 2020-09-15 DIAGNOSIS — D485 Neoplasm of uncertain behavior of skin: Secondary | ICD-10-CM | POA: Diagnosis not present

## 2020-09-23 DIAGNOSIS — Z01419 Encounter for gynecological examination (general) (routine) without abnormal findings: Secondary | ICD-10-CM | POA: Diagnosis not present

## 2020-09-23 DIAGNOSIS — Z1231 Encounter for screening mammogram for malignant neoplasm of breast: Secondary | ICD-10-CM | POA: Diagnosis not present

## 2020-09-23 DIAGNOSIS — Z6835 Body mass index (BMI) 35.0-35.9, adult: Secondary | ICD-10-CM | POA: Diagnosis not present

## 2020-09-23 LAB — HM MAMMOGRAPHY

## 2020-09-28 ENCOUNTER — Other Ambulatory Visit: Payer: Self-pay | Admitting: Obstetrics and Gynecology

## 2020-09-28 DIAGNOSIS — R928 Other abnormal and inconclusive findings on diagnostic imaging of breast: Secondary | ICD-10-CM

## 2020-10-07 ENCOUNTER — Ambulatory Visit
Admission: RE | Admit: 2020-10-07 | Discharge: 2020-10-07 | Disposition: A | Discharge status: Home / Self Care | Payer: BC Managed Care – PPO | Source: Ambulatory Visit | Attending: Obstetrics and Gynecology | Admitting: Obstetrics and Gynecology

## 2020-10-07 ENCOUNTER — Other Ambulatory Visit: Payer: Self-pay

## 2020-10-07 DIAGNOSIS — R928 Other abnormal and inconclusive findings on diagnostic imaging of breast: Secondary | ICD-10-CM

## 2020-10-07 DIAGNOSIS — R922 Inconclusive mammogram: Secondary | ICD-10-CM | POA: Diagnosis not present

## 2020-10-07 DIAGNOSIS — N6002 Solitary cyst of left breast: Secondary | ICD-10-CM | POA: Diagnosis not present

## 2020-10-12 ENCOUNTER — Other Ambulatory Visit: Payer: Self-pay | Admitting: Family Medicine

## 2020-10-18 ENCOUNTER — Other Ambulatory Visit: Payer: BC Managed Care – PPO

## 2020-10-19 ENCOUNTER — Other Ambulatory Visit: Payer: BC Managed Care – PPO

## 2020-10-29 ENCOUNTER — Other Ambulatory Visit: Payer: Self-pay | Admitting: Family Medicine

## 2020-10-29 NOTE — Telephone Encounter (Signed)
LFD 10/13/20 #30 with 1 refill LOV 07/30/20 NOV none

## 2020-11-13 ENCOUNTER — Other Ambulatory Visit: Payer: Self-pay | Admitting: Family Medicine

## 2020-11-19 ENCOUNTER — Other Ambulatory Visit: Payer: Self-pay | Admitting: Family Medicine

## 2020-11-20 DIAGNOSIS — J029 Acute pharyngitis, unspecified: Secondary | ICD-10-CM | POA: Diagnosis not present

## 2020-11-26 ENCOUNTER — Other Ambulatory Visit: Payer: Self-pay | Admitting: Family Medicine

## 2020-11-26 NOTE — Telephone Encounter (Signed)
LFD 07/30/20 #30 with 3 refills LOV 07/30/20 NOV none

## 2020-12-05 DIAGNOSIS — R531 Weakness: Secondary | ICD-10-CM | POA: Diagnosis not present

## 2020-12-06 DIAGNOSIS — R1110 Vomiting, unspecified: Secondary | ICD-10-CM | POA: Diagnosis not present

## 2020-12-06 DIAGNOSIS — Z793 Long term (current) use of hormonal contraceptives: Secondary | ICD-10-CM | POA: Diagnosis not present

## 2020-12-06 DIAGNOSIS — R42 Dizziness and giddiness: Secondary | ICD-10-CM | POA: Diagnosis not present

## 2020-12-06 DIAGNOSIS — R110 Nausea: Secondary | ICD-10-CM | POA: Diagnosis not present

## 2020-12-06 DIAGNOSIS — Z79899 Other long term (current) drug therapy: Secondary | ICD-10-CM | POA: Diagnosis not present

## 2020-12-06 DIAGNOSIS — Z87891 Personal history of nicotine dependence: Secondary | ICD-10-CM | POA: Diagnosis not present

## 2020-12-11 ENCOUNTER — Other Ambulatory Visit: Payer: Self-pay | Admitting: Family Medicine

## 2020-12-24 ENCOUNTER — Other Ambulatory Visit: Payer: Self-pay | Admitting: Family Medicine

## 2020-12-28 ENCOUNTER — Encounter: Payer: Self-pay | Admitting: Family Medicine

## 2020-12-28 ENCOUNTER — Other Ambulatory Visit: Payer: Self-pay | Admitting: Family Medicine

## 2020-12-28 DIAGNOSIS — M797 Fibromyalgia: Secondary | ICD-10-CM

## 2020-12-29 MED ORDER — MELOXICAM 15 MG PO TABS: 15.0000 mg | ORAL_TABLET | Freq: Every day | ORAL | 0 refills | Status: DC

## 2020-12-29 NOTE — Telephone Encounter (Signed)
Last appointment: 07/30/2020 No future appointment scheduled.

## 2021-01-14 ENCOUNTER — Encounter: Payer: Self-pay | Admitting: Family Medicine

## 2021-01-31 ENCOUNTER — Encounter: Payer: Self-pay | Admitting: Family Medicine

## 2021-02-02 ENCOUNTER — Ambulatory Visit (INDEPENDENT_AMBULATORY_CARE_PROVIDER_SITE_OTHER): Payer: BC Managed Care – PPO | Admitting: Family Medicine

## 2021-02-02 ENCOUNTER — Other Ambulatory Visit: Payer: Self-pay | Admitting: Family Medicine

## 2021-02-02 ENCOUNTER — Encounter: Payer: Self-pay | Admitting: Family Medicine

## 2021-02-02 ENCOUNTER — Other Ambulatory Visit: Payer: Self-pay | Admitting: Family

## 2021-02-02 VITALS — BP 122/80 | HR 75 | Temp 97.2°F | Resp 16 | Wt 216.0 lb

## 2021-02-02 DIAGNOSIS — F419 Anxiety disorder, unspecified: Secondary | ICD-10-CM | POA: Diagnosis not present

## 2021-02-02 DIAGNOSIS — M797 Fibromyalgia: Secondary | ICD-10-CM

## 2021-02-02 DIAGNOSIS — E669 Obesity, unspecified: Secondary | ICD-10-CM

## 2021-02-02 DIAGNOSIS — E538 Deficiency of other specified B group vitamins: Secondary | ICD-10-CM

## 2021-02-02 DIAGNOSIS — E559 Vitamin D deficiency, unspecified: Secondary | ICD-10-CM | POA: Diagnosis not present

## 2021-02-02 LAB — BASIC METABOLIC PANEL
BUN: 13 mg/dL (ref 6–23)
CO2: 25 mEq/L (ref 19–32)
Calcium: 9.2 mg/dL (ref 8.4–10.5)
Chloride: 101 mEq/L (ref 96–112)
Creatinine, Ser: 0.9 mg/dL (ref 0.40–1.20)
GFR: 72.97 mL/min (ref >60.00)
Glucose, Bld: 92 mg/dL (ref 70–99)
Potassium: 4.6 mEq/L (ref 3.5–5.1)
Sodium: 137 mEq/L (ref 135–145)

## 2021-02-02 LAB — CBC WITH DIFFERENTIAL/PLATELET
Basophils Absolute: 0.1 10*3/uL (ref 0.0–0.1)
Basophils Relative: 0.7 % (ref 0.0–3.0)
Eosinophils Absolute: 0.3 10*3/uL (ref 0.0–0.7)
Eosinophils Relative: 3.8 % (ref 0.0–5.0)
HCT: 43.4 % (ref 36.0–46.0)
Hemoglobin: 14.2 g/dL (ref 12.0–15.0)
Lymphocytes Relative: 24 % (ref 12.0–46.0)
Lymphs Abs: 2 10*3/uL (ref 0.7–4.0)
MCHC: 32.7 g/dL (ref 30.0–36.0)
MCV: 86.3 fl (ref 78.0–100.0)
Monocytes Absolute: 0.4 10*3/uL (ref 0.1–1.0)
Monocytes Relative: 4.9 % (ref 3.0–12.0)
Neutro Abs: 5.6 10*3/uL (ref 1.4–7.7)
Neutrophils Relative %: 66.6 % (ref 43.0–77.0)
Platelets: 299 10*3/uL (ref 150.0–400.0)
RBC: 5.03 Mil/uL (ref 3.87–5.11)
RDW: 15.1 % (ref 11.5–15.5)
WBC: 8.5 10*3/uL (ref 4.0–10.5)

## 2021-02-02 LAB — VITAMIN D 25 HYDROXY (VIT D DEFICIENCY, FRACTURES): VITD: 46.34 ng/mL (ref 30.00–100.00)

## 2021-02-02 LAB — HEPATIC FUNCTION PANEL
ALT: 13 U/L (ref 0–35)
AST: 12 U/L (ref 0–37)
Albumin: 4 g/dL (ref 3.5–5.2)
Alkaline Phosphatase: 65 U/L (ref 39–117)
Bilirubin, Direct: 0.1 mg/dL (ref 0.0–0.3)
Total Bilirubin: 0.3 mg/dL (ref 0.2–1.2)
Total Protein: 6.4 g/dL (ref 6.0–8.3)

## 2021-02-02 LAB — VITAMIN B12: Vitamin B-12: 395 pg/mL (ref 211–911)

## 2021-02-02 LAB — LIPID PANEL
Cholesterol: 220 mg/dL — ABNORMAL HIGH (ref 0–200)
HDL: 43.9 mg/dL (ref >39.00)
LDL Cholesterol: 138 mg/dL — ABNORMAL HIGH (ref 0–99)
NonHDL: 176.11
Total CHOL/HDL Ratio: 5
Triglycerides: 189 mg/dL — ABNORMAL HIGH (ref 0.0–149.0)
VLDL: 37.8 mg/dL (ref 0.0–40.0)

## 2021-02-02 LAB — TSH: TSH: 2.99 u[IU]/mL (ref 0.35–5.50)

## 2021-02-02 MED ORDER — MELOXICAM 15 MG PO TABS: 15.0000 mg | ORAL_TABLET | Freq: Every day | ORAL | 1 refills | Status: DC

## 2021-02-02 NOTE — Assessment & Plan Note (Signed)
Check labs and replete prn. 

## 2021-02-02 NOTE — Assessment & Plan Note (Signed)
Pt feels sxs are better controlled since starting Lyrica BID.  Meloxicam daily is also very helpful and she has pain on the days she doesn't take it.  Methocarbamol as needed for spasm.  No med changes at this time.

## 2021-02-02 NOTE — Progress Notes (Signed)
   Subjective:    Patient ID: Jacqueline Mcfarland, female    DOB: 1965-10-20, 55 y.o.   MRN: [id number]  HPI Fibro- chronic problem, on Lyrica 75mg  BID, Zoloft 100mg  daily, Meloxicam 15mg  daily, and Methocarbamol prn.  Attempted to stop Meloxicam but almost 'immediately had terrible pain'.  She feels that the Lyrica is very beneficial in regards to her fibro pain.  Anxiety- chronic problem.  On Zoloft 100mg  daily and alprazolam prn.  She continues to have intermittent 'panic attacks when I worry about things I shouldn't worry about'.  Not feeling as wound all the time.  Pt feels baseline anxiety has improved overall.  Obesity- ongoing issue.  BMI is 34.86.  Attempting to walk more regularly   Review of Systems For ROS see HPI   This visit occurred during the SARS-CoV-2 public health emergency.  Safety protocols were in place, including screening questions prior to the visit, additional usage of staff PPE, and extensive cleaning of exam room while observing appropriate contact time as indicated for disinfecting solutions.      Objective:   Physical Exam Vitals reviewed.  Constitutional:      General: She is not in acute distress.    Appearance: Normal appearance. She is well-developed. She is obese. She is not ill-appearing.  HENT:     Head: Normocephalic and atraumatic.  Eyes:     Conjunctiva/sclera: Conjunctivae normal.     Pupils: Pupils are equal, round, and reactive to light.  Neck:     Thyroid: No thyromegaly.  Cardiovascular:     Rate and Rhythm: Normal rate and regular rhythm.     Pulses: Normal pulses.     Heart sounds: Normal heart sounds. No murmur heard. Pulmonary:     Effort: Pulmonary effort is normal. No respiratory distress.     Breath sounds: Normal breath sounds.  Abdominal:     General: There is no distension.     Palpations: Abdomen is soft.     Tenderness: There is no abdominal tenderness.  Musculoskeletal:     Cervical back: Normal range of motion and neck  supple.     Right lower leg: No edema.     Left lower leg: No edema.  Lymphadenopathy:     Cervical: No cervical adenopathy.  Skin:    General: Skin is warm and dry.  Neurological:     Mental Status: She is alert and oriented to person, place, and time.  Psychiatric:        Behavior: Behavior normal.          Assessment & Plan:

## 2021-02-02 NOTE — Telephone Encounter (Signed)
Patient is requesting a refill of the following medications: Requested Prescriptions   Pending Prescriptions Disp Refills   meloxicam (MOBIC) 15 MG tablet 30 tablet 0    Sig: Take 1 tablet (15 mg total) by mouth daily. Appt is due    Date of patient request:  Last office visit: 02/02/2021 Date of last refill: 12/29/2020 Last refill amount: 30 tablets  Follow up time period per chart: 08/02/2021

## 2021-02-02 NOTE — Assessment & Plan Note (Signed)
Improved w/ increased dose of Sertraline.  Feels that her day to day anxiety is well managed but she continues to have panicked moments.  At those time, she will use the Alprazolam w/ good results.  No med changes at this time.  Will follow.

## 2021-02-02 NOTE — Patient Instructions (Signed)
Schedule your complete physical in 6 months We'll notify you of your lab results and make any changes if needed Continue to work on healthy diet and regular exercise- you can do it! When you start to spiral, remember you have done these things and been fine Call with any questions or concerns Happy Holidays!

## 2021-02-02 NOTE — Assessment & Plan Note (Signed)
Ongoing issue for pt.  Encouraged healthy diet and regular exercise.  Check labs to risk stratify.  Will follow. 

## 2021-02-04 ENCOUNTER — Other Ambulatory Visit: Payer: Self-pay | Admitting: Family Medicine

## 2021-02-04 ENCOUNTER — Other Ambulatory Visit: Payer: Self-pay

## 2021-02-04 DIAGNOSIS — E559 Vitamin D deficiency, unspecified: Secondary | ICD-10-CM

## 2021-02-04 MED ORDER — PREGABALIN 75 MG PO CAPS: 75.0000 mg | ORAL_CAPSULE | Freq: Two times a day (BID) | ORAL | 3 refills | Status: DC

## 2021-02-04 MED ORDER — CYANOCOBALAMIN 1000 MCG/ML IJ SOLN: INTRAMUSCULAR | 6 refills | Status: DC

## 2021-02-04 MED ORDER — VITAMIN D (ERGOCALCIFEROL) 1.25 MG (50000 UNIT) PO CAPS: 50000.0000 [IU] | ORAL_CAPSULE | ORAL | 0 refills | Status: DC

## 2021-02-04 NOTE — Progress Notes (Signed)
Patient is aware. She stated she is taking the red yeast rice again. Med refills that patient requested has been sent to the pharmacy

## 2021-02-21 ENCOUNTER — Encounter: Payer: Self-pay | Admitting: Family Medicine

## 2021-03-02 ENCOUNTER — Other Ambulatory Visit: Payer: Self-pay | Admitting: Family Medicine

## 2021-03-14 ENCOUNTER — Other Ambulatory Visit: Payer: Self-pay | Admitting: Family Medicine

## 2021-03-16 ENCOUNTER — Other Ambulatory Visit: Payer: Self-pay | Admitting: Family Medicine

## 2021-03-19 ENCOUNTER — Other Ambulatory Visit: Payer: Self-pay | Admitting: Family

## 2021-04-27 ENCOUNTER — Other Ambulatory Visit: Payer: Self-pay | Admitting: Family Medicine

## 2021-04-30 DIAGNOSIS — K046 Periapical abscess with sinus: Secondary | ICD-10-CM | POA: Diagnosis not present

## 2021-05-27 ENCOUNTER — Other Ambulatory Visit: Payer: Self-pay | Admitting: Family Medicine

## 2021-07-07 ENCOUNTER — Other Ambulatory Visit: Payer: Self-pay | Admitting: Family Medicine

## 2021-07-08 ENCOUNTER — Other Ambulatory Visit: Payer: Self-pay | Admitting: Family Medicine

## 2021-07-08 NOTE — Telephone Encounter (Signed)
Patient is requesting a refill of the following medications: ?Requested Prescriptions  ? ?Pending Prescriptions Disp Refills  ? sertraline (ZOLOFT) 100 MG tablet [Pharmacy Med Name: sertraline 100 mg tablet] 30 tablet 3  ?  Sig: TAKE 1 TABLET BY MOUTH DAILY  ? ? ?Date of patient request: 07/07/2021 ?Last office visit: 02/02/2021 ?Date of last refill: 03/16/2021 ?Last refill amount: 30 tablets 3 refills  ?Follow up time period per chart: 08/03/2021 ? ?

## 2021-07-15 ENCOUNTER — Other Ambulatory Visit: Payer: Self-pay | Admitting: Family Medicine

## 2021-07-15 NOTE — Telephone Encounter (Signed)
Patient is requesting a refill of the following medications: ?Requested Prescriptions  ? ?Pending Prescriptions Disp Refills  ? ALPRAZolam (XANAX) 0.5 MG tablet [Pharmacy Med Name: alprazolam 0.5 mg tablet] 30 tablet 3  ?  Sig: TAKE 1/2 TO 1 TABLET TWICE DAILY AS NEEDED FOR ANXIETY ATTACKS  ? ? ?Date of patient request: 07/15/21 ?Last office visit: 01/28/21 ?Date of last refill: 04/22/21 ?Last refill amount: 30 ? ? ?

## 2021-07-16 DIAGNOSIS — M25511 Pain in right shoulder: Secondary | ICD-10-CM | POA: Diagnosis not present

## 2021-07-16 DIAGNOSIS — Y92410 Unspecified street and highway as the place of occurrence of the external cause: Secondary | ICD-10-CM | POA: Diagnosis not present

## 2021-07-16 DIAGNOSIS — M25531 Pain in right wrist: Secondary | ICD-10-CM | POA: Diagnosis not present

## 2021-07-16 DIAGNOSIS — M7989 Other specified soft tissue disorders: Secondary | ICD-10-CM | POA: Diagnosis not present

## 2021-07-16 DIAGNOSIS — Y9389 Activity, other specified: Secondary | ICD-10-CM | POA: Diagnosis not present

## 2021-07-16 DIAGNOSIS — M25521 Pain in right elbow: Secondary | ICD-10-CM | POA: Diagnosis not present

## 2021-07-16 DIAGNOSIS — M79601 Pain in right arm: Secondary | ICD-10-CM | POA: Diagnosis not present

## 2021-07-28 ENCOUNTER — Other Ambulatory Visit: Payer: Self-pay | Admitting: Family Medicine

## 2021-07-28 DIAGNOSIS — M797 Fibromyalgia: Secondary | ICD-10-CM

## 2021-08-02 ENCOUNTER — Encounter: Payer: BC Managed Care – PPO | Admitting: Family Medicine

## 2021-08-03 ENCOUNTER — Ambulatory Visit (INDEPENDENT_AMBULATORY_CARE_PROVIDER_SITE_OTHER): Payer: BC Managed Care – PPO | Admitting: Family Medicine

## 2021-08-03 ENCOUNTER — Other Ambulatory Visit: Payer: Self-pay

## 2021-08-03 ENCOUNTER — Encounter: Payer: Self-pay | Admitting: Family Medicine

## 2021-08-03 VITALS — BP 132/80 | HR 86 | Temp 97.6°F | Resp 18 | Ht 66.0 in | Wt 224.4 lb

## 2021-08-03 DIAGNOSIS — Z0000 Encounter for general adult medical examination without abnormal findings: Secondary | ICD-10-CM | POA: Diagnosis not present

## 2021-08-03 DIAGNOSIS — E669 Obesity, unspecified: Secondary | ICD-10-CM

## 2021-08-03 DIAGNOSIS — E559 Vitamin D deficiency, unspecified: Secondary | ICD-10-CM

## 2021-08-03 DIAGNOSIS — R030 Elevated blood-pressure reading, without diagnosis of hypertension: Secondary | ICD-10-CM

## 2021-08-03 LAB — CBC WITH DIFFERENTIAL/PLATELET
Basophils Absolute: 0 10*3/uL (ref 0.0–0.1)
Basophils Relative: 0.3 % (ref 0.0–3.0)
Eosinophils Absolute: 0.3 10*3/uL (ref 0.0–0.7)
Eosinophils Relative: 3.1 % (ref 0.0–5.0)
HCT: 39.9 % (ref 36.0–46.0)
Hemoglobin: 13.4 g/dL (ref 12.0–15.0)
Lymphocytes Relative: 32.8 % (ref 12.0–46.0)
Lymphs Abs: 3.2 10*3/uL (ref 0.7–4.0)
MCHC: 33.7 g/dL (ref 30.0–36.0)
MCV: 86.5 fl (ref 78.0–100.0)
Monocytes Absolute: 0.6 10*3/uL (ref 0.1–1.0)
Monocytes Relative: 6.1 % (ref 3.0–12.0)
Neutro Abs: 5.7 10*3/uL (ref 1.4–7.7)
Neutrophils Relative %: 57.7 % (ref 43.0–77.0)
Platelets: 310 10*3/uL (ref 150.0–400.0)
RBC: 4.61 Mil/uL (ref 3.87–5.11)
RDW: 15 % (ref 11.5–15.5)
WBC: 9.9 10*3/uL (ref 4.0–10.5)

## 2021-08-03 LAB — BASIC METABOLIC PANEL
BUN: 16 mg/dL (ref 6–23)
CO2: 28 mEq/L (ref 19–32)
Calcium: 9.3 mg/dL (ref 8.4–10.5)
Chloride: 102 mEq/L (ref 96–112)
Creatinine, Ser: 0.95 mg/dL (ref 0.40–1.20)
GFR: 68.15 mL/min (ref >60.00)
Glucose, Bld: 95 mg/dL (ref 70–99)
Potassium: 4 mEq/L (ref 3.5–5.1)
Sodium: 140 mEq/L (ref 135–145)

## 2021-08-03 LAB — HEPATIC FUNCTION PANEL
ALT: 16 U/L (ref 0–35)
AST: 14 U/L (ref 0–37)
Albumin: 3.9 g/dL (ref 3.5–5.2)
Alkaline Phosphatase: 61 U/L (ref 39–117)
Bilirubin, Direct: 0.1 mg/dL (ref 0.0–0.3)
Total Bilirubin: 0.4 mg/dL (ref 0.2–1.2)
Total Protein: 6.3 g/dL (ref 6.0–8.3)

## 2021-08-03 LAB — LIPID PANEL
Cholesterol: 208 mg/dL — ABNORMAL HIGH (ref 0–200)
HDL: 42.4 mg/dL (ref >39.00)
NonHDL: 166.03
Total CHOL/HDL Ratio: 5
Triglycerides: 262 mg/dL — ABNORMAL HIGH (ref 0.0–149.0)
VLDL: 52.4 mg/dL — ABNORMAL HIGH (ref 0.0–40.0)

## 2021-08-03 LAB — VITAMIN D 25 HYDROXY (VIT D DEFICIENCY, FRACTURES): VITD: 40.1 ng/mL (ref 30.00–100.00)

## 2021-08-03 LAB — LDL CHOLESTEROL, DIRECT: Direct LDL: 143 mg/dL

## 2021-08-03 LAB — TSH: TSH: 3.16 u[IU]/mL (ref 0.35–5.50)

## 2021-08-03 MED ORDER — DICLOFENAC SODIUM 1 % EX GEL: 4.0000 g | Freq: Four times a day (QID) | CUTANEOUS | 1 refills | Status: DC

## 2021-08-03 NOTE — Assessment & Plan Note (Signed)
Pt's PE WNL w/ exception of obesity.  UTD on mammo, pap, cologuard, Tdap, flu.  Check labs.  Anticipatory guidance provided.  ?

## 2021-08-03 NOTE — Assessment & Plan Note (Signed)
Deteriorated.  + 8 lb weight gain.  BMI now 36.22  Discussed need for healthy diet, regular exercise.  Check labs to risk stratify.  Will follow. ?

## 2021-08-03 NOTE — Patient Instructions (Addendum)
Follow up in 4-6 weeks to recheck BP ?We'll notify you of your lab results and make any changes if needed ?Try and work on low carb diet and regular exercise- you can do it! ?I sent the Voltaren to the pharmacy ?Call with any questions or concerns ?Stay Safe!  Stay Healthy! ?Happy Bel[employer] Birthday!!! ?

## 2021-08-03 NOTE — Progress Notes (Signed)
? ?  Subjective:  ? ? Patient ID: Jacqueline Mcfarland, female    DOB: 1965/11/10, 56 y.o.   MRN: [id number] ? ?HPI ?CPE- UTD on mammo, pap, Cologuard,.  UTD on flu, Tdap ? ?Patient Care Team  ?  Relationship Specialty Notifications Start End  ?Midge Minium, MD PCP - General Family Medicine  10/02/16   ?Bobbye Charleston, MD Consulting Physician Obstetrics and Gynecology  12/20/18   ?  ?Health Maintenance  ?Topic Date Due  ? COVID-19 Vaccine (3 - Booster for Moderna series) 08/19/2021 (Originally 08/28/2019)  ? Zoster Vaccines- Shingrix (1 of 2) 11/03/2021 (Originally 07/19/2017)  ? MAMMOGRAM  09/23/2021  ? INFLUENZA VACCINE  10/18/2021  ? Fecal DNA (Cologuard)  01/16/2022  ? PAP SMEAR-Modifier  05/21/2022  ? TETANUS/TDAP  10/02/2025  ? Hepatitis C Screening  Completed  ? HIV Screening  Completed  ? HPV VACCINES  Aged Out  ?  ? ? ?Review of Systems ?Patient reports no vision/ hearing changes, adenopathy,fever, persistant/recurrent hoarseness, swallowing issues, chest pain, palpitations, edema, persistant/recurrent cough, hemoptysis, dyspnea (rest/exertional/paroxysmal nocturnal), gastrointestinal bleeding (melena, rectal bleeding), abdominal pain, significant heartburn, bowel changes, GU symptoms (dysuria, hematuria, incontinence), Gyn symptoms (abnormal  bleeding, pain),  syncope, focal weakness, memory loss, numbness & tingling, skin/hair/nail changes, abnormal bruising or bleeding, anxiety, or depression.  ? ?+ 8 lb weight gain ?   ?Objective:  ? Physical Exam ?General Appearance:    Alert, cooperative, no distress, appears stated age, obese  ?Head:    Normocephalic, without obvious abnormality, atraumatic  ?Eyes:    PERRL, conjunctiva/corneas clear, EOM's intact both eyes  ?Ears:    Normal TM's and external ear canals, both ears  ?Nose:   Nares normal, septum midline, mucosa normal, no drainage  ?  or sinus tenderness  ?Throat:   Lips, mucosa, and tongue normal; teeth and gums normal  ?Neck:   Supple, symmetrical,  trachea midline, no adenopathy;  ?  Thyroid: no enlargement/tenderness/nodules  ?Back:     Symmetric, no curvature, ROM normal, no CVA tenderness  ?Lungs:     Clear to auscultation bilaterally, respirations unlabored  ?Chest Wall:    No tenderness or deformity  ? Heart:    Regular rate and rhythm, S1 and S2 normal, no murmur, rub ?  or gallop  ?Breast Exam:    Deferred to GYN  ?Abdomen:     Soft, non-tender, bowel sounds active all four quadrants,  ?  no masses, no organomegaly  ?Genitalia:    Deferred to GYN  ?Rectal:    ?Extremities:   Extremities normal, atraumatic, no cyanosis or edema  ?Pulses:   2+ and symmetric all extremities  ?Skin:   Skin color, texture, turgor normal, no rashes or lesions  ?Lymph nodes:   Cervical, supraclavicular, and axillary nodes normal  ?Neurologic:   CNII-XII intact, normal strength, sensation and reflexes  ?  throughout  ?  ? ? ? ?   ?Assessment & Plan:  ? ? ?

## 2021-08-03 NOTE — Assessment & Plan Note (Signed)
BP was much higher than usual upon arrival.  This improved prior to leaving but still not at her usual baseline of 120s/80s.  Pt will come back in 4-6 weeks to recheck BP and make sure this is not a developing problem.  Pt expressed understanding and is in agreement w/ plan.  ?

## 2021-08-03 NOTE — Assessment & Plan Note (Signed)
Check labs and replete prn. 

## 2021-08-04 ENCOUNTER — Telehealth: Payer: Self-pay

## 2021-08-04 NOTE — Telephone Encounter (Signed)
-----   Message from Midge Minium, MD sent at 08/03/2021  9:02 PM EDT ----- Labs look good w/ exception of LDL (bad cholesterol) which remains high.  Please continue to work on healthy diet and regular exercise and in the mean time, please add OTC red yeast rice supplement daily to improve this number.

## 2021-08-23 ENCOUNTER — Encounter: Payer: Self-pay | Admitting: Family Medicine

## 2021-09-08 ENCOUNTER — Other Ambulatory Visit: Payer: Self-pay | Admitting: Family Medicine

## 2021-09-09 ENCOUNTER — Ambulatory Visit: Payer: BC Managed Care – PPO | Admitting: Family Medicine

## 2021-09-09 DIAGNOSIS — J019 Acute sinusitis, unspecified: Secondary | ICD-10-CM | POA: Diagnosis not present

## 2021-09-21 ENCOUNTER — Other Ambulatory Visit: Payer: Self-pay | Admitting: Family Medicine

## 2021-09-29 ENCOUNTER — Encounter: Payer: Self-pay | Admitting: Family Medicine

## 2021-09-29 DIAGNOSIS — Z6835 Body mass index (BMI) 35.0-35.9, adult: Secondary | ICD-10-CM | POA: Diagnosis not present

## 2021-09-29 DIAGNOSIS — Z8041 Family history of malignant neoplasm of ovary: Secondary | ICD-10-CM | POA: Diagnosis not present

## 2021-09-29 DIAGNOSIS — Z808 Family history of malignant neoplasm of other organs or systems: Secondary | ICD-10-CM | POA: Diagnosis not present

## 2021-09-29 DIAGNOSIS — N951 Menopausal and female climacteric states: Secondary | ICD-10-CM | POA: Insufficient documentation

## 2021-09-29 DIAGNOSIS — Z800 Family history of malignant neoplasm of digestive organs: Secondary | ICD-10-CM | POA: Diagnosis not present

## 2021-09-29 DIAGNOSIS — N6320 Unspecified lump in the left breast, unspecified quadrant: Secondary | ICD-10-CM | POA: Insufficient documentation

## 2021-09-29 DIAGNOSIS — Z01419 Encounter for gynecological examination (general) (routine) without abnormal findings: Secondary | ICD-10-CM | POA: Diagnosis not present

## 2021-09-29 DIAGNOSIS — Z801 Family history of malignant neoplasm of trachea, bronchus and lung: Secondary | ICD-10-CM | POA: Diagnosis not present

## 2021-09-29 LAB — HM PAP SMEAR

## 2021-09-30 ENCOUNTER — Other Ambulatory Visit: Payer: Self-pay | Admitting: Obstetrics and Gynecology

## 2021-09-30 DIAGNOSIS — N6320 Unspecified lump in the left breast, unspecified quadrant: Secondary | ICD-10-CM

## 2021-10-04 ENCOUNTER — Encounter: Payer: Self-pay | Admitting: Family Medicine

## 2021-10-04 ENCOUNTER — Ambulatory Visit (INDEPENDENT_AMBULATORY_CARE_PROVIDER_SITE_OTHER): Payer: BC Managed Care – PPO | Admitting: Family Medicine

## 2021-10-04 VITALS — BP 128/82 | HR 82 | Temp 97.6°F | Resp 17 | Ht 66.0 in | Wt 222.4 lb

## 2021-10-04 DIAGNOSIS — J329 Chronic sinusitis, unspecified: Secondary | ICD-10-CM

## 2021-10-04 DIAGNOSIS — B9689 Other specified bacterial agents as the cause of diseases classified elsewhere: Secondary | ICD-10-CM

## 2021-10-04 MED ORDER — DOXYCYCLINE HYCLATE 100 MG PO TABS: 100.0000 mg | ORAL_TABLET | Freq: Two times a day (BID) | ORAL | 0 refills | Status: DC

## 2021-10-04 NOTE — Patient Instructions (Signed)
Follow up as needed or as scheduled START the Doxycycline twice daily as directed- take w/ food Drink LOTS of fluids Take daily allergy pill (Claritin or Zyrtec) Call with any questions or concerns Hang in there!!!

## 2021-10-04 NOTE — Progress Notes (Signed)
   Subjective:    Patient ID: Jacqueline Mcfarland, female    DOB: 05-Jul-1965, 56 y.o.   MRN: [id number]  HPI URI- sxs started 6/21.  Did a video visit on 6/23.  Was started on Augmentin x7 days.  'i don't think it touched it'.  Continues to have R ear pain.  Has white patches on back of tongue/throat.  No fevers.  + HA.  + facial pain/pressure.  + tooth pain.  Denies nasal congestion.  Rare cough.  Pt reports this feels similar to previous infections.   Review of Systems For ROS see HPI     Objective:   Physical Exam Vitals reviewed.  Constitutional:      General: She is not in acute distress.    Appearance: Normal appearance. She is well-developed. She is not ill-appearing.  HENT:     Head: Normocephalic and atraumatic.     Right Ear: Tympanic membrane normal.     Left Ear: Tympanic membrane normal.     Nose: Mucosal edema and rhinorrhea present.     Right Sinus: Maxillary sinus tenderness and frontal sinus tenderness present.     Left Sinus: Maxillary sinus tenderness and frontal sinus tenderness present.     Mouth/Throat:     Pharynx: Uvula midline. Posterior oropharyngeal erythema present. No oropharyngeal exudate.  Eyes:     Conjunctiva/sclera: Conjunctivae normal.     Pupils: Pupils are equal, round, and reactive to light.  Cardiovascular:     Rate and Rhythm: Normal rate and regular rhythm.     Heart sounds: Normal heart sounds.  Pulmonary:     Effort: Pulmonary effort is normal. No respiratory distress.     Breath sounds: Normal breath sounds. No wheezing.  Musculoskeletal:     Cervical back: Normal range of motion and neck supple.  Lymphadenopathy:     Cervical: No cervical adenopathy.  Skin:    General: Skin is warm and dry.  Neurological:     General: No focal deficit present.     Mental Status: She is alert and oriented to person, place, and time.     Cranial Nerves: No cranial nerve deficit.     Motor: No weakness.     Coordination: Coordination normal.   Psychiatric:        Mood and Affect: Mood normal.        Behavior: Behavior normal.        Thought Content: Thought content normal.           Assessment & Plan:   Bacterial sinusitis- new.  Pt has hx of previous infxns and she reports this is similar.  Given the duration of illness and lack of improvement, will start Doxycycline.  Reviewed supportive care and red flags that should prompt return.  Pt expressed understanding and is in agreement w/ plan.

## 2021-10-06 ENCOUNTER — Other Ambulatory Visit: Payer: Self-pay | Admitting: Obstetrics and Gynecology

## 2021-10-06 ENCOUNTER — Ambulatory Visit
Admission: RE | Admit: 2021-10-06 | Discharge: 2021-10-06 | Disposition: A | Discharge status: Home / Self Care | Payer: BC Managed Care – PPO | Source: Ambulatory Visit | Attending: Obstetrics and Gynecology | Admitting: Obstetrics and Gynecology

## 2021-10-06 DIAGNOSIS — N6310 Unspecified lump in the right breast, unspecified quadrant: Secondary | ICD-10-CM

## 2021-10-06 DIAGNOSIS — N6489 Other specified disorders of breast: Secondary | ICD-10-CM | POA: Diagnosis not present

## 2021-10-06 DIAGNOSIS — R928 Other abnormal and inconclusive findings on diagnostic imaging of breast: Secondary | ICD-10-CM | POA: Diagnosis not present

## 2021-10-06 DIAGNOSIS — N6320 Unspecified lump in the left breast, unspecified quadrant: Secondary | ICD-10-CM

## 2021-10-12 DIAGNOSIS — D2361 Other benign neoplasm of skin of right upper limb, including shoulder: Secondary | ICD-10-CM | POA: Diagnosis not present

## 2021-10-12 DIAGNOSIS — L814 Other melanin hyperpigmentation: Secondary | ICD-10-CM | POA: Diagnosis not present

## 2021-10-12 DIAGNOSIS — L821 Other seborrheic keratosis: Secondary | ICD-10-CM | POA: Diagnosis not present

## 2021-10-12 DIAGNOSIS — D485 Neoplasm of uncertain behavior of skin: Secondary | ICD-10-CM | POA: Diagnosis not present

## 2021-10-14 ENCOUNTER — Other Ambulatory Visit: Payer: Self-pay | Admitting: Family Medicine

## 2021-10-21 DIAGNOSIS — S46001A Unspecified injury of muscle(s) and tendon(s) of the rotator cuff of right shoulder, initial encounter: Secondary | ICD-10-CM | POA: Diagnosis not present

## 2021-10-30 DIAGNOSIS — M25511 Pain in right shoulder: Secondary | ICD-10-CM | POA: Diagnosis not present

## 2021-10-31 ENCOUNTER — Other Ambulatory Visit: Payer: Self-pay | Admitting: Family Medicine

## 2021-11-04 ENCOUNTER — Other Ambulatory Visit: Payer: Self-pay | Admitting: Family Medicine

## 2021-11-04 NOTE — Telephone Encounter (Signed)
Last Visit 10/21/2021 Zoloft 11/19/2020 30 Tablets

## 2021-11-07 ENCOUNTER — Other Ambulatory Visit: Payer: Self-pay | Admitting: Family Medicine

## 2021-11-07 DIAGNOSIS — M19011 Primary osteoarthritis, right shoulder: Secondary | ICD-10-CM | POA: Diagnosis not present

## 2021-11-07 DIAGNOSIS — M67813 Other specified disorders of tendon, right shoulder: Secondary | ICD-10-CM | POA: Diagnosis not present

## 2021-11-07 NOTE — Telephone Encounter (Signed)
Patient is requesting a refill of the following medications: Requested Prescriptions   Pending Prescriptions Disp Refills   ALPRAZolam (XANAX) 0.5 MG tablet [Pharmacy Med Name: alprazolam 0.5 mg tablet] 30 tablet 3    Sig: TAKE 1/2 TO 1 TABLET TWICE DAILY AS NEEDED FOR ANXIETY ATTACKS    Date of patient request: 11/07/2021 Last office visit: 10/04/2021 Date of last refill: 07/18/2021 Last refill amount: 30 tablets 3 refills  Follow up time period per chart: 08/09/2022

## 2021-11-08 DIAGNOSIS — M26623 Arthralgia of bilateral temporomandibular joint: Secondary | ICD-10-CM | POA: Diagnosis not present

## 2021-11-08 DIAGNOSIS — H6981 Other specified disorders of Eustachian tube, right ear: Secondary | ICD-10-CM | POA: Diagnosis not present

## 2021-11-08 DIAGNOSIS — J329 Chronic sinusitis, unspecified: Secondary | ICD-10-CM | POA: Diagnosis not present

## 2021-11-08 DIAGNOSIS — H9203 Otalgia, bilateral: Secondary | ICD-10-CM | POA: Diagnosis not present

## 2021-11-11 DIAGNOSIS — M25511 Pain in right shoulder: Secondary | ICD-10-CM | POA: Diagnosis not present

## 2021-11-15 ENCOUNTER — Encounter: Payer: Self-pay | Admitting: Family Medicine

## 2021-11-16 ENCOUNTER — Other Ambulatory Visit: Payer: Self-pay

## 2021-11-16 DIAGNOSIS — D485 Neoplasm of uncertain behavior of skin: Secondary | ICD-10-CM | POA: Diagnosis not present

## 2021-11-16 DIAGNOSIS — M797 Fibromyalgia: Secondary | ICD-10-CM

## 2021-11-16 DIAGNOSIS — L905 Scar conditions and fibrosis of skin: Secondary | ICD-10-CM | POA: Diagnosis not present

## 2021-11-16 MED ORDER — FLUTICASONE PROPIONATE 50 MCG/ACT NA SUSP
1.0000 | Freq: Every day | NASAL | 1 refills | Status: DC
Start: 2021-11-16 — End: 2022-01-09

## 2021-11-16 MED ORDER — METHOCARBAMOL 500 MG PO TABS: 500.0000 mg | ORAL_TABLET | Freq: Three times a day (TID) | ORAL | 1 refills | Status: DC | PRN

## 2021-11-16 MED ORDER — MELOXICAM 15 MG PO TABS: 15.0000 mg | ORAL_TABLET | Freq: Every day | ORAL | 1 refills | Status: DC

## 2021-11-23 DIAGNOSIS — M67813 Other specified disorders of tendon, right shoulder: Secondary | ICD-10-CM | POA: Diagnosis not present

## 2022-01-04 DIAGNOSIS — R1013 Epigastric pain: Secondary | ICD-10-CM | POA: Diagnosis not present

## 2022-01-07 ENCOUNTER — Other Ambulatory Visit: Payer: Self-pay | Admitting: Family Medicine

## 2022-01-09 NOTE — Telephone Encounter (Signed)
Patient is requesting a refill of the following medications: Requested Prescriptions   Pending Prescriptions Disp Refills   methocarbamol (ROBAXIN) 500 MG tablet [Pharmacy Med Name: methocarbamol 500 mg tablet] 90 tablet 1    Sig: TAKE 1 TABLET BY MOUTH EVERY 8 HOURS AS NEEDED   fluticasone (FLONASE) 50 MCG/ACT nasal spray [Pharmacy Med Name: fluticasone propionate 50 mcg/actuation nasal spray,suspension] 16 g 1    Sig: USE 1 SPRAY INTO EACH NOSTRIL DAILY    Date of patient request: 01/09/22 Last office visit: 10/04/21 Date of last refill: 02/04/21 Last refill amount: 90

## 2022-01-14 ENCOUNTER — Other Ambulatory Visit: Payer: Self-pay | Admitting: Family Medicine

## 2022-01-26 DIAGNOSIS — K2950 Unspecified chronic gastritis without bleeding: Secondary | ICD-10-CM | POA: Diagnosis not present

## 2022-01-26 DIAGNOSIS — K319 Disease of stomach and duodenum, unspecified: Secondary | ICD-10-CM | POA: Diagnosis not present

## 2022-01-26 DIAGNOSIS — K222 Esophageal obstruction: Secondary | ICD-10-CM | POA: Diagnosis not present

## 2022-01-26 DIAGNOSIS — R1013 Epigastric pain: Secondary | ICD-10-CM | POA: Diagnosis not present

## 2022-01-26 DIAGNOSIS — H9190 Unspecified hearing loss, unspecified ear: Secondary | ICD-10-CM | POA: Diagnosis not present

## 2022-01-26 DIAGNOSIS — K219 Gastro-esophageal reflux disease without esophagitis: Secondary | ICD-10-CM | POA: Diagnosis not present

## 2022-01-26 DIAGNOSIS — K209 Esophagitis, unspecified without bleeding: Secondary | ICD-10-CM | POA: Diagnosis not present

## 2022-02-09 ENCOUNTER — Other Ambulatory Visit: Payer: Self-pay | Admitting: Family Medicine

## 2022-02-25 ENCOUNTER — Other Ambulatory Visit: Payer: Self-pay | Admitting: Family Medicine

## 2022-03-04 ENCOUNTER — Other Ambulatory Visit: Payer: Self-pay | Admitting: Family Medicine

## 2022-03-06 ENCOUNTER — Other Ambulatory Visit: Payer: Self-pay | Admitting: Family Medicine

## 2022-03-06 NOTE — Telephone Encounter (Signed)
Patient is requesting a refill of the following medications: Requested Prescriptions   Pending Prescriptions Disp Refills   ALPRAZolam (XANAX) 0.5 MG tablet [Pharmacy Med Name: alprazolam 0.5 mg tablet] 30 tablet 3    Sig: TAKE 1/2 TO 1 TABLET TWICE DAILY AS NEEDED FOR ANXIETY ATTACKS    Date of patient request: 03/06/22 Last office visit: 10/04/21 Date of last refill: 11/08/21 Last refill amount: 30

## 2022-03-07 DIAGNOSIS — S93401A Sprain of unspecified ligament of right ankle, initial encounter: Secondary | ICD-10-CM | POA: Diagnosis not present

## 2022-03-07 DIAGNOSIS — M79671 Pain in right foot: Secondary | ICD-10-CM | POA: Diagnosis not present

## 2022-03-07 DIAGNOSIS — R2689 Other abnormalities of gait and mobility: Secondary | ICD-10-CM | POA: Diagnosis not present

## 2022-03-07 DIAGNOSIS — R1010 Upper abdominal pain, unspecified: Secondary | ICD-10-CM | POA: Diagnosis not present

## 2022-03-23 ENCOUNTER — Other Ambulatory Visit: Payer: Self-pay

## 2022-03-23 DIAGNOSIS — K469 Unspecified abdominal hernia without obstruction or gangrene: Secondary | ICD-10-CM | POA: Diagnosis not present

## 2022-03-23 DIAGNOSIS — K3189 Other diseases of stomach and duodenum: Secondary | ICD-10-CM | POA: Diagnosis not present

## 2022-03-23 DIAGNOSIS — R932 Abnormal findings on diagnostic imaging of liver and biliary tract: Secondary | ICD-10-CM | POA: Diagnosis not present

## 2022-03-23 DIAGNOSIS — K760 Fatty (change of) liver, not elsewhere classified: Secondary | ICD-10-CM | POA: Diagnosis not present

## 2022-03-23 DIAGNOSIS — N3289 Other specified disorders of bladder: Secondary | ICD-10-CM | POA: Diagnosis not present

## 2022-03-23 DIAGNOSIS — K429 Umbilical hernia without obstruction or gangrene: Secondary | ICD-10-CM | POA: Diagnosis not present

## 2022-03-23 DIAGNOSIS — R9389 Abnormal findings on diagnostic imaging of other specified body structures: Secondary | ICD-10-CM | POA: Diagnosis not present

## 2022-03-23 DIAGNOSIS — Z9049 Acquired absence of other specified parts of digestive tract: Secondary | ICD-10-CM | POA: Diagnosis not present

## 2022-03-23 DIAGNOSIS — Z1211 Encounter for screening for malignant neoplasm of colon: Secondary | ICD-10-CM

## 2022-03-23 DIAGNOSIS — R1010 Upper abdominal pain, unspecified: Secondary | ICD-10-CM | POA: Diagnosis not present

## 2022-04-11 DIAGNOSIS — K432 Incisional hernia without obstruction or gangrene: Secondary | ICD-10-CM | POA: Diagnosis not present

## 2022-04-13 DIAGNOSIS — Z1211 Encounter for screening for malignant neoplasm of colon: Secondary | ICD-10-CM | POA: Diagnosis not present

## 2022-04-14 DIAGNOSIS — K439 Ventral hernia without obstruction or gangrene: Secondary | ICD-10-CM | POA: Diagnosis not present

## 2022-04-14 DIAGNOSIS — Z6834 Body mass index (BMI) 34.0-34.9, adult: Secondary | ICD-10-CM | POA: Diagnosis not present

## 2022-04-14 DIAGNOSIS — K219 Gastro-esophageal reflux disease without esophagitis: Secondary | ICD-10-CM | POA: Diagnosis not present

## 2022-04-14 DIAGNOSIS — Z79899 Other long term (current) drug therapy: Secondary | ICD-10-CM | POA: Diagnosis not present

## 2022-04-14 DIAGNOSIS — E669 Obesity, unspecified: Secondary | ICD-10-CM | POA: Diagnosis not present

## 2022-04-14 DIAGNOSIS — M1990 Unspecified osteoarthritis, unspecified site: Secondary | ICD-10-CM | POA: Diagnosis not present

## 2022-04-14 DIAGNOSIS — Z8616 Personal history of COVID-19: Secondary | ICD-10-CM | POA: Diagnosis not present

## 2022-04-17 HISTORY — PX: HERNIA REPAIR: SHX51

## 2022-04-21 LAB — COLOGUARD: COLOGUARD: NEGATIVE

## 2022-04-22 ENCOUNTER — Encounter: Payer: Self-pay | Admitting: Family Medicine

## 2022-04-27 DIAGNOSIS — K432 Incisional hernia without obstruction or gangrene: Secondary | ICD-10-CM | POA: Diagnosis not present

## 2022-05-04 ENCOUNTER — Other Ambulatory Visit: Payer: Self-pay | Admitting: Family Medicine

## 2022-05-10 ENCOUNTER — Ambulatory Visit: Payer: BC Managed Care – PPO | Admitting: Family Medicine

## 2022-05-12 ENCOUNTER — Encounter: Payer: Self-pay | Admitting: Family Medicine

## 2022-05-12 ENCOUNTER — Ambulatory Visit (INDEPENDENT_AMBULATORY_CARE_PROVIDER_SITE_OTHER): Payer: BC Managed Care – PPO | Admitting: Family Medicine

## 2022-05-12 VITALS — BP 118/80 | HR 66 | Temp 98.6°F | Resp 19 | Ht 66.0 in | Wt 210.4 lb

## 2022-05-12 DIAGNOSIS — J329 Chronic sinusitis, unspecified: Secondary | ICD-10-CM | POA: Diagnosis not present

## 2022-05-12 DIAGNOSIS — H6991 Unspecified Eustachian tube disorder, right ear: Secondary | ICD-10-CM

## 2022-05-12 DIAGNOSIS — B9689 Other specified bacterial agents as the cause of diseases classified elsewhere: Secondary | ICD-10-CM

## 2022-05-12 MED ORDER — AMOXICILLIN 875 MG PO TABS: 875.0000 mg | ORAL_TABLET | Freq: Two times a day (BID) | ORAL | 0 refills | Status: AC

## 2022-05-12 MED ORDER — MOMETASONE FUROATE 50 MCG/ACT NA SUSP: 2.0000 | Freq: Every day | NASAL | 12 refills | Status: AC

## 2022-05-12 NOTE — Progress Notes (Unsigned)
   Subjective:    Patient ID: Jacqueline Mcfarland, female    DOB: 1966-03-12, 57 y.o.   MRN: [id number]  HPI Ear pain- R side, sxs started ~2 weeks ago.  Pain is 'not constant but frequent'.  Denies drainage from ear.  Will have sharp pains on R side of throat.  Unclear if fever.  Mild sinus pressure.  No L ear pain.  No cough.   Review of Systems For ROS see HPI     Objective:   Physical Exam Vitals reviewed.  Constitutional:      General: She is not in acute distress.    Appearance: Normal appearance. She is well-developed.  HENT:     Head: Normocephalic and atraumatic.     Right Ear: Ear canal normal. Tympanic membrane is retracted.     Left Ear: Tympanic membrane and ear canal normal.     Nose: Mucosal edema and rhinorrhea present.     Right Sinus: Maxillary sinus tenderness and frontal sinus tenderness present.     Left Sinus: Maxillary sinus tenderness and frontal sinus tenderness present.     Mouth/Throat:     Pharynx: Uvula midline. Posterior oropharyngeal erythema present. No oropharyngeal exudate.  Eyes:     Conjunctiva/sclera: Conjunctivae normal.     Pupils: Pupils are equal, round, and reactive to light.  Cardiovascular:     Rate and Rhythm: Normal rate and regular rhythm.     Heart sounds: Normal heart sounds.  Pulmonary:     Effort: Pulmonary effort is normal. No respiratory distress.     Breath sounds: Normal breath sounds. No wheezing.  Musculoskeletal:     Cervical back: Normal range of motion and neck supple.  Lymphadenopathy:     Cervical: No cervical adenopathy.  Skin:    General: Skin is warm and dry.  Neurological:     General: No focal deficit present.     Mental Status: She is alert and oriented to person, place, and time.     Cranial Nerves: No cranial nerve deficit.     Motor: No weakness.     Coordination: Coordination normal.  Psychiatric:        Mood and Affect: Mood normal.        Behavior: Behavior normal.        Thought Content: Thought  content normal.           Assessment & Plan:  Bacterial sinusitis- new.  Pt very TTP over frontal and maxillary sinuses.  Start abx.  Reviewed supportive care and red flags that should prompt return.  Pt expressed understanding and is in agreement w/ plan.   R ear pain- recurrent.  No evidence of infxn.  TM retracted- likely due to sinus infxn.  Start Nasonex as pt is not having relief w/ Flonase.  Reviewed supportive care and red flags that should prompt return.  Pt expressed understanding and is in agreement w/ plan.

## 2022-05-12 NOTE — Patient Instructions (Signed)
Follow up as needed or as scheduled START the Amoxicillin twice daily- take w/ food USE the Nasonex- 2 sprays each nostril daily CONTINUE the allergy medication daily Drink LOTS of fluids Call with any questions or concerns Hang in there!!!

## 2022-06-06 ENCOUNTER — Encounter (INDEPENDENT_AMBULATORY_CARE_PROVIDER_SITE_OTHER): Payer: BC Managed Care – PPO | Admitting: Family Medicine

## 2022-06-06 DIAGNOSIS — B9689 Other specified bacterial agents as the cause of diseases classified elsewhere: Secondary | ICD-10-CM

## 2022-06-06 DIAGNOSIS — J329 Chronic sinusitis, unspecified: Secondary | ICD-10-CM

## 2022-06-06 MED ORDER — DOXYCYCLINE HYCLATE 100 MG PO TABS: 100.0000 mg | ORAL_TABLET | Freq: Two times a day (BID) | ORAL | 0 refills | Status: DC

## 2022-06-06 NOTE — Telephone Encounter (Signed)
[facility] VISIT   Patient agreed to [facility] visit and is aware that copayment and coinsurance may apply. Patient was treated using telemedicine according to accepted telemedicine protocols.  Subjective:   Patient complains of sinus infection  Patient Active Problem List   Diagnosis Date Noted   Menopausal syndrome 09/29/2021   Mass of left breast 09/29/2021   Elevated BP without diagnosis of hypertension 08/03/2021   Vitamin D deficiency 02/02/2021   Fibromyalgia 08/10/2020   Raynaud's disease without gangrene 08/10/2020   GERD (gastroesophageal reflux disease) 01/20/2020   Physical exam 12/20/2018   Anxiety 06/14/2018   Allergic rhinitis 12/14/2017   Obesity (BMI 30-39.9) 10/02/2016   B12 deficiency 10/02/2016   Hyperlipidemia 10/02/2016   Social History   Tobacco Use   Smoking status: Former    Types: Cigarettes    Quit date: 10/03/1991    Years since quitting: 30.6   Smokeless tobacco: Never  Substance Use Topics   Alcohol use: Yes    Current Outpatient Medications:    doxycycline (VIBRA-TABS) 100 MG tablet, Take 1 tablet (100 mg total) by mouth 2 (two) times daily., Disp: 20 tablet, Rfl: 0   ALPRAZolam (XANAX) 0.5 MG tablet, TAKE 1/2 TO 1 TABLET TWICE DAILY AS NEEDED FOR ANXIETY ATTACKS, Disp: 30 tablet, Rfl: 3   B-D 3CC LUER-LOK SYR 25GX1" 25G X 1" 3 ML MISC, USE AS DIRECTED, Disp: 3 each, Rfl: 0   Chlorpheniramine Maleate (ALLERGY PO), Take 1 tablet by mouth daily., Disp: , Rfl:    cyanocobalamin (VITAMIN B12) 1000 MCG/ML injection, INJECT 1 ML INTO THE MUSCLE EVERY 10 DAYS AS DIRECTED, Disp: 3 mL, Rfl: 6   diclofenac Sodium (VOLTAREN) 1 % GEL, APPLY 4 grams TO THE AFFECTED AREA 4 TIMES DAILY, Disp: 300 g, Rfl: 1   ergocalciferol (VITAMIN D2) 1.25 MG (50000 UT) capsule, Take 1 capsule by mouth every 7 (seven) days., Disp: , Rfl:    fluticasone (FLONASE) 50 MCG/ACT nasal spray, USE 1 SPRAY INTO EACH NOSTRIL DAILY, Disp: 16 g, Rfl: 1   Meclizine HCl 25 MG CHEW, Chew 25  mg by mouth in the morning, at noon, and at bedtime., Disp: , Rfl:    methocarbamol (ROBAXIN) 500 MG tablet, TAKE 1 TABLET BY MOUTH EVERY 8 HOURS AS NEEDED FOR MUSCLE SPASMS, Disp: 30 tablet, Rfl: 1   methocarbamol (ROBAXIN) 500 MG tablet, TAKE 1 TABLET BY MOUTH EVERY 8 HOURS AS NEEDED, Disp: 90 tablet, Rfl: 1   mometasone (NASONEX) 50 MCG/ACT nasal spray, Place 2 sprays into the nose daily., Disp: 1 each, Rfl: 12   norethindrone-ethinyl estradiol (JINTELI) 1-5 MG-MCG TABS tablet, Take 5 tablets by mouth daily., Disp: , Rfl:    pregabalin (LYRICA) 75 MG capsule, TAKE 1 CAPSULE BY MOUTH TWICE DAILY, Disp: 60 capsule, Rfl: 3   sertraline (ZOLOFT) 100 MG tablet, TAKE 1 TABLET BY MOUTH DAILY, Disp: 30 tablet, Rfl: 3   Vitamin D, Ergocalciferol, (DRISDOL) 1.25 MG (50000 UNIT) CAPS capsule, Take 1 capsule (50,000 Units total) by mouth every 7 (seven) days., Disp: 12 capsule, Rfl: 0  No Known Allergies  Assessment and Plan:   Diagnosis: bacterial sinusitis. Please see myChart communication and orders below.   No orders of the defined types were placed in this encounter.  Meds ordered this encounter  Medications   doxycycline (VIBRA-TABS) 100 MG tablet    Sig: Take 1 tablet (100 mg total) by mouth 2 (two) times daily.    Dispense:  20 tablet    Refill:  0    Annye Asa, MD 06/06/2022  A total of 6 minutes were spent by me to personally review the patient-generated inquiry, review patient records and data pertinent to assessment of the patient's problem, develop a management plan including generation of prescriptions and/or orders, and on subsequent communication with the patient through secure the MyChart portal service.   There is no separately reported E/M service related to this service in the past 7 days nor does the patient have an upcoming soonest available appointment for this issue. This work was completed in less than 7 days.   The patient consented to this service today (see  patient agreement prior to ongoing communication). Patient counseled regarding the need for in-person exam for certain conditions and was advised to call the office if any changing or worsening symptoms occur.   The codes to be used for the E/M service are: [x]   99421 for 5-10 minutes of time spent on the inquiry. []   [id number] for 11-20 minutes. []   [id number] for 21+ minutes.

## 2022-06-06 NOTE — Telephone Encounter (Signed)
Pt appt 05/12/22 for sinusitis, please advise if patient should then have a secondary appt.

## 2022-06-23 ENCOUNTER — Other Ambulatory Visit: Payer: Self-pay | Admitting: Family Medicine

## 2022-06-27 ENCOUNTER — Other Ambulatory Visit: Payer: Self-pay | Admitting: Family Medicine

## 2022-06-27 NOTE — Telephone Encounter (Signed)
Pt aware Rx has been sent in.  

## 2022-06-27 NOTE — Telephone Encounter (Signed)
Xanax 0.5 mg LOV: 05/12/22 Last Refill:03/06/22/ Upcoming appt: 08/09/22

## 2022-07-03 ENCOUNTER — Other Ambulatory Visit: Payer: Self-pay | Admitting: Family Medicine

## 2022-07-15 ENCOUNTER — Other Ambulatory Visit: Payer: Self-pay | Admitting: Family Medicine

## 2022-07-15 DIAGNOSIS — M797 Fibromyalgia: Secondary | ICD-10-CM

## 2022-07-21 ENCOUNTER — Encounter: Payer: Self-pay | Admitting: Family Medicine

## 2022-07-21 DIAGNOSIS — N3000 Acute cystitis without hematuria: Secondary | ICD-10-CM | POA: Diagnosis not present

## 2022-08-03 DIAGNOSIS — F419 Anxiety disorder, unspecified: Secondary | ICD-10-CM | POA: Diagnosis not present

## 2022-08-09 ENCOUNTER — Encounter: Payer: Self-pay | Admitting: Family Medicine

## 2022-08-09 ENCOUNTER — Other Ambulatory Visit: Payer: Self-pay | Admitting: Family Medicine

## 2022-08-09 ENCOUNTER — Ambulatory Visit (INDEPENDENT_AMBULATORY_CARE_PROVIDER_SITE_OTHER): Payer: BC Managed Care – PPO | Admitting: Family Medicine

## 2022-08-09 VITALS — BP 124/80 | HR 66 | Temp 97.8°F | Resp 17 | Ht 66.0 in | Wt 215.2 lb

## 2022-08-09 DIAGNOSIS — E559 Vitamin D deficiency, unspecified: Secondary | ICD-10-CM

## 2022-08-09 DIAGNOSIS — E538 Deficiency of other specified B group vitamins: Secondary | ICD-10-CM

## 2022-08-09 DIAGNOSIS — Z0000 Encounter for general adult medical examination without abnormal findings: Secondary | ICD-10-CM

## 2022-08-09 DIAGNOSIS — E669 Obesity, unspecified: Secondary | ICD-10-CM

## 2022-08-09 DIAGNOSIS — Z1231 Encounter for screening mammogram for malignant neoplasm of breast: Secondary | ICD-10-CM

## 2022-08-09 LAB — TSH: TSH: 3.17 u[IU]/mL (ref 0.35–5.50)

## 2022-08-09 LAB — LIPID PANEL
Cholesterol: 201 mg/dL — ABNORMAL HIGH (ref 0–200)
HDL: 37.6 mg/dL — ABNORMAL LOW (ref >39.00)
NonHDL: 163.77
Total CHOL/HDL Ratio: 5
Triglycerides: 223 mg/dL — ABNORMAL HIGH (ref 0.0–149.0)
VLDL: 44.6 mg/dL — ABNORMAL HIGH (ref 0.0–40.0)

## 2022-08-09 LAB — B12 AND FOLATE PANEL
Folate: >23.9 ng/mL (ref >5.9)
Vitamin B-12: 1218 pg/mL — ABNORMAL HIGH (ref 211–911)

## 2022-08-09 LAB — CBC WITH DIFFERENTIAL/PLATELET
Basophils Absolute: 0.1 10*3/uL (ref 0.0–0.1)
Basophils Relative: 0.9 % (ref 0.0–3.0)
Eosinophils Absolute: 0.3 10*3/uL (ref 0.0–0.7)
Eosinophils Relative: 2.8 % (ref 0.0–5.0)
HCT: 41.1 % (ref 36.0–46.0)
Hemoglobin: 13.4 g/dL (ref 12.0–15.0)
Lymphocytes Relative: 29.3 % (ref 12.0–46.0)
Lymphs Abs: 3 10*3/uL (ref 0.7–4.0)
MCHC: 32.7 g/dL (ref 30.0–36.0)
MCV: 85.7 fl (ref 78.0–100.0)
Monocytes Absolute: 0.6 10*3/uL (ref 0.1–1.0)
Monocytes Relative: 5.5 % (ref 3.0–12.0)
Neutro Abs: 6.3 10*3/uL (ref 1.4–7.7)
Neutrophils Relative %: 61.5 % (ref 43.0–77.0)
Platelets: 324 10*3/uL (ref 150.0–400.0)
RBC: 4.8 Mil/uL (ref 3.87–5.11)
RDW: 16.2 % — ABNORMAL HIGH (ref 11.5–15.5)
WBC: 10.3 10*3/uL (ref 4.0–10.5)

## 2022-08-09 LAB — HEPATIC FUNCTION PANEL
ALT: 19 U/L (ref 0–35)
AST: 18 U/L (ref 0–37)
Albumin: 3.9 g/dL (ref 3.5–5.2)
Alkaline Phosphatase: 66 U/L (ref 39–117)
Bilirubin, Direct: 0 mg/dL (ref 0.0–0.3)
Total Bilirubin: 0.4 mg/dL (ref 0.2–1.2)
Total Protein: 6.7 g/dL (ref 6.0–8.3)

## 2022-08-09 LAB — BASIC METABOLIC PANEL
BUN: 17 mg/dL (ref 6–23)
CO2: 28 mEq/L (ref 19–32)
Calcium: 9.5 mg/dL (ref 8.4–10.5)
Chloride: 102 mEq/L (ref 96–112)
Creatinine, Ser: 0.96 mg/dL (ref 0.40–1.20)
GFR: 66.82 mL/min (ref >60.00)
Glucose, Bld: 94 mg/dL (ref 70–99)
Potassium: 4 mEq/L (ref 3.5–5.1)
Sodium: 139 mEq/L (ref 135–145)

## 2022-08-09 LAB — LDL CHOLESTEROL, DIRECT: Direct LDL: 155 mg/dL

## 2022-08-09 LAB — VITAMIN D 25 HYDROXY (VIT D DEFICIENCY, FRACTURES): VITD: 54.21 ng/mL (ref 30.00–100.00)

## 2022-08-09 MED ORDER — PREGABALIN 75 MG PO CAPS: 75.0000 mg | ORAL_CAPSULE | Freq: Two times a day (BID) | ORAL | 3 refills | Status: DC

## 2022-08-09 NOTE — Assessment & Plan Note (Signed)
Check labs and replete prn. 

## 2022-08-09 NOTE — Telephone Encounter (Signed)
Pt did not get her Rx at the visit today  Lyrica 75 mg LOV: 08/09/22 Last Refill:08/09/22 Upcoming appt:08/10/23

## 2022-08-09 NOTE — Telephone Encounter (Signed)
Pt aware of refill.

## 2022-08-09 NOTE — Progress Notes (Signed)
   Subjective:    Patient ID: Jacqueline Mcfarland, female    DOB: [birth year], 57 y.o.   MRN: [id number]  HPI CPE- UTD on pap, mammo, cologuard, Tdap  Patient Care Team    Relationship Specialty Notifications Start End  Sheliah Hatch, MD PCP - General Family Medicine  10/02/16   Carrington Clamp, MD Consulting Physician Obstetrics and Gynecology  12/20/18     Health Maintenance  Topic Date Due   Zoster Vaccines- Shingrix (1 of 2) 08/10/2022 (Originally 07/19/2017)   MAMMOGRAM  10/07/2022   INFLUENZA VACCINE  10/19/2022   PAP SMEAR-Modifier  09/29/2024   Fecal DNA (Cologuard)  04/13/2025   DTaP/Tdap/Td (2 - Td or Tdap) 10/02/2025   Hepatitis C Screening  Completed   HIV Screening  Completed   HPV VACCINES  Aged Out   COVID-19 Vaccine  Discontinued      Review of Systems Patient reports no vision/ hearing changes, adenopathy,fever, persistant/recurrent hoarseness , swallowing issues, chest pain, palpitations, edema, persistant/recurrent cough, hemoptysis, dyspnea (rest/exertional/paroxysmal nocturnal), gastrointestinal bleeding (melena, rectal bleeding), abdominal pain, significant heartburn, bowel changes, GU symptoms (dysuria, hematuria, incontinence), Gyn symptoms (abnormal  bleeding, pain),  syncope, focal weakness, memory loss, numbness & tingling, skin/hair/nail changes, abnormal bruising or bleeding, anxiety, or depression.   + 5 lb weight gain    Objective:   Physical Exam General Appearance:    Alert, cooperative, no distress, appears stated age, obese  Head:    Normocephalic, without obvious abnormality, atraumatic  Eyes:    PERRL, conjunctiva/corneas clear, EOM's intact both eyes  Ears:    Normal TM's and external ear canals, both ears  Nose:   Nares normal, septum midline, mucosa normal, no drainage    or sinus tenderness  Throat:   Lips, mucosa, and tongue normal; teeth and gums normal  Neck:   Supple, symmetrical, trachea midline, no adenopathy;    Thyroid: no  enlargement/tenderness/nodules  Back:     Symmetric, no curvature, ROM normal, no CVA tenderness  Lungs:     Clear to auscultation bilaterally, respirations unlabored  Chest Wall:    No tenderness or deformity   Heart:    Regular rate and rhythm, S1 and S2 normal, no murmur, rub   or gallop  Breast Exam:    Deferred to GYN  Abdomen:     Soft, non-tender, bowel sounds active all four quadrants,    no masses, no organomegaly  Genitalia:    Deferred to GYN  Rectal:    Extremities:   Extremities normal, atraumatic, no cyanosis or edema  Pulses:   2+ and symmetric all extremities  Skin:   Skin color, texture, turgor normal, no rashes or lesions  Lymph nodes:   Cervical, supraclavicular, and axillary nodes normal  Neurologic:   CNII-XII intact, normal strength, sensation and reflexes    throughout          Assessment & Plan:

## 2022-08-09 NOTE — Assessment & Plan Note (Signed)
Deteriorated.  Pt has gained 5 lbs since last visit.  Encouraged low carb diet and regular exercise.  Check labs to risk stratify.  Will follow. 

## 2022-08-09 NOTE — Patient Instructions (Signed)
Follow up in 1 year or as needed We'll notify you of your lab results and make any changes if needed Continue to work on healthy diet and regular exercise- you can do it! Call with any questions or concerns Stay Safe!  Stay Healthy! Have a great summer!!! 

## 2022-08-09 NOTE — Assessment & Plan Note (Signed)
Pt's PE WNL w/ exception of BMI.  UTD on pap, mammo, cologuard, Tdap.  Check labs.  Anticipatory guidance provided.

## 2022-08-10 ENCOUNTER — Telehealth: Payer: Self-pay

## 2022-08-10 ENCOUNTER — Other Ambulatory Visit: Payer: Self-pay

## 2022-08-10 DIAGNOSIS — E785 Hyperlipidemia, unspecified: Secondary | ICD-10-CM

## 2022-08-10 MED ORDER — ROSUVASTATIN CALCIUM 10 MG PO TABS
10.0000 mg | ORAL_TABLET | Freq: Every day | ORAL | 6 refills | Status: DC
Start: 2022-08-10 — End: 2023-04-09

## 2022-08-10 NOTE — Telephone Encounter (Signed)
Pt aware of lab results . Lab and 6 month f./u apts have been made . Liver function order is in and Crestor 10 mg sent to pharmacy

## 2022-08-10 NOTE — Telephone Encounter (Signed)
-----   Message from Sheliah Hatch, MD sent at 08/09/2022  8:49 PM EDT ----- Labs are stable and look good w/ exception of cholesterol.  Your LDL (bad cholesterol) has increased to 155.  Based on this and the CT results showing some calcifications, I recommend starting Crestor 10mg  nightly (#30, 6 refills) in addition to healthy diet and regular exercise.  We will need to repeat your liver functions at a lab only visit in 6-8 weeks (dx hyperlipidemia).  I also want to schedule a follow up visit in 6 months (rather than 1 year)

## 2022-08-16 DIAGNOSIS — F419 Anxiety disorder, unspecified: Secondary | ICD-10-CM | POA: Diagnosis not present

## 2022-08-24 ENCOUNTER — Other Ambulatory Visit: Payer: Self-pay | Admitting: Family Medicine

## 2022-08-24 NOTE — Telephone Encounter (Signed)
Pt aware of refill.

## 2022-08-24 NOTE — Telephone Encounter (Signed)
Xanax 0.5 mg LOV: 08/09/22 Last Refill:06/27/22 Upcoming appt: 02/09/23

## 2022-08-26 IMAGING — US US BREAST*L* LIMITED INC AXILLA
1 series · 6 of 6 positions shown · non-contrast
Comparison: Previous exam(s).

CLINICAL DATA: Screening recall for a possible left breast mass.

EXAM:
DIGITAL DIAGNOSTIC UNILATERAL LEFT MAMMOGRAM WITH TOMOSYNTHESIS AND
CAD; ULTRASOUND LEFT BREAST LIMITED
TECHNIQUE: Left digital diagnostic mammography and breast tomosynthesis was
performed. The images were evaluated with computer-aided detection.;
Targeted ultrasound examination of the left breast was performed

[Series 1: us breast*left* limited inc axilla · 0.06mm/px · 6 of 6 slices shown]
[im 1/6]
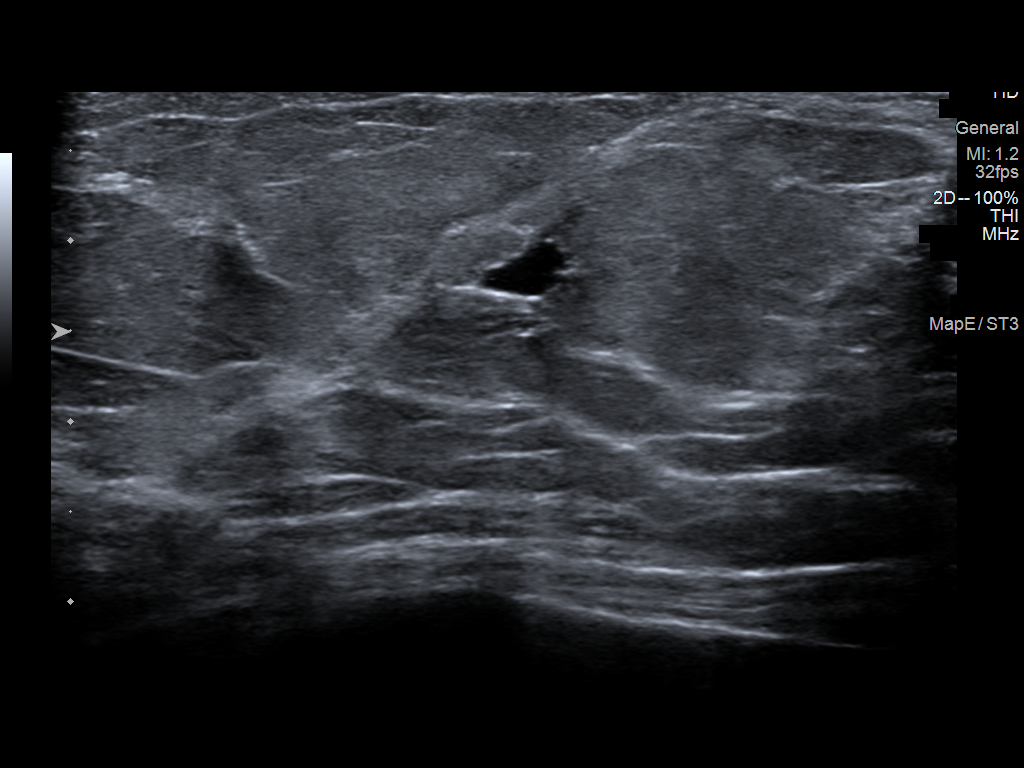
[im 2/6]
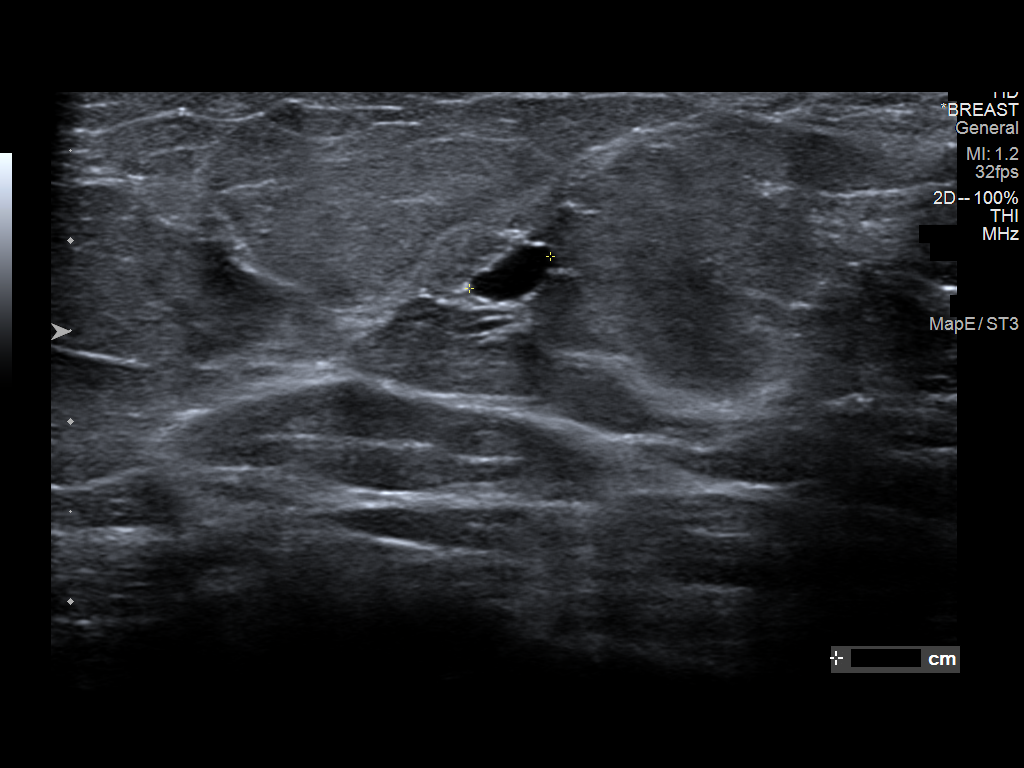
[im 3/6]
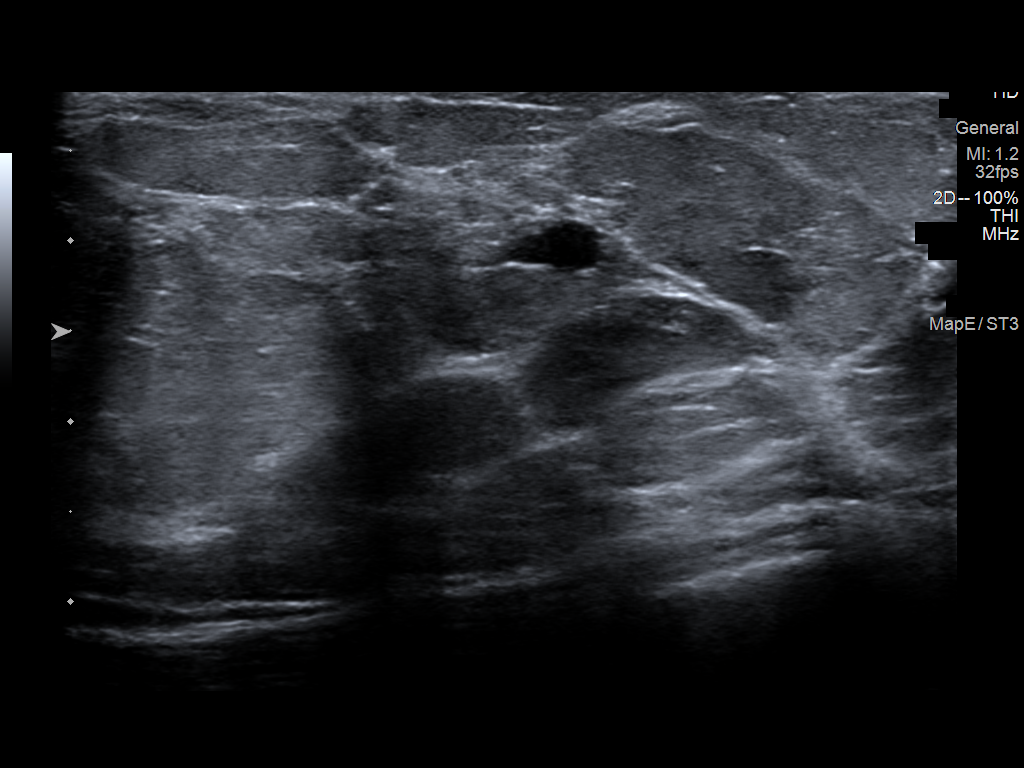
[im 4/6]
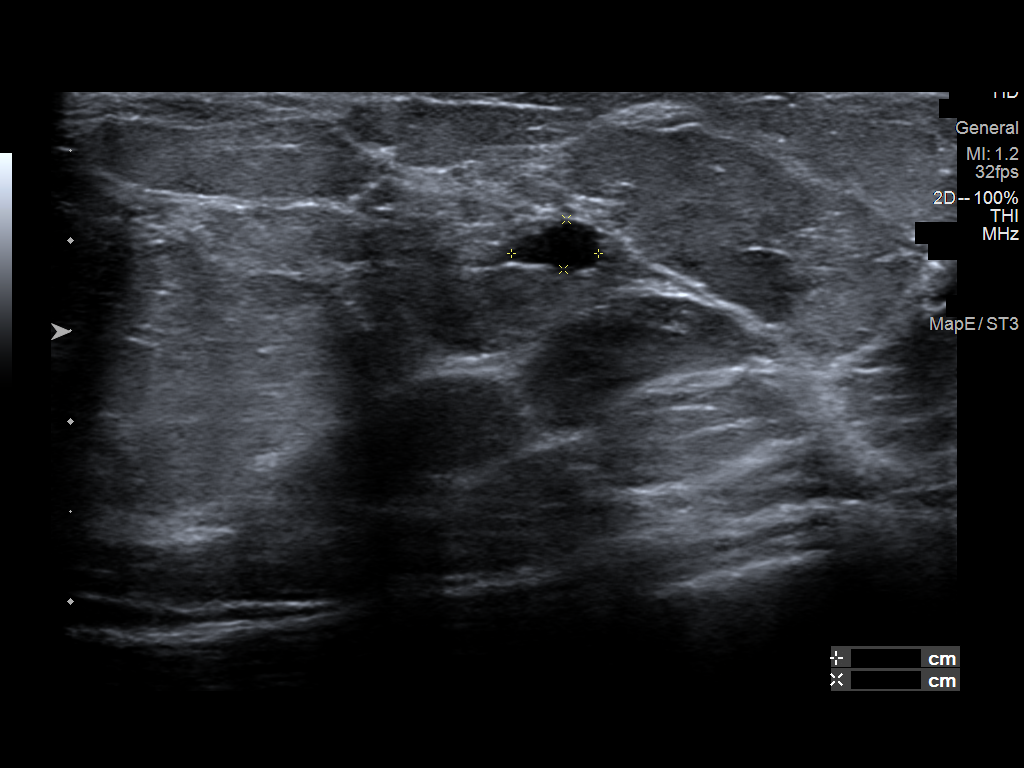
[im 5/6]
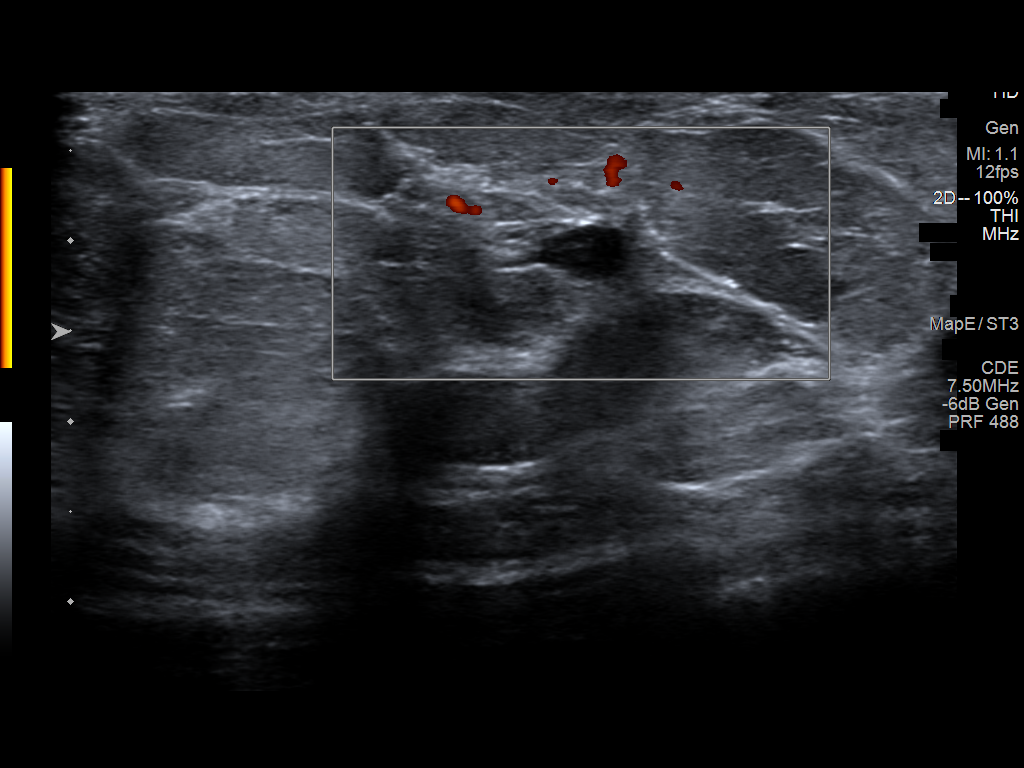
[im 6/6]
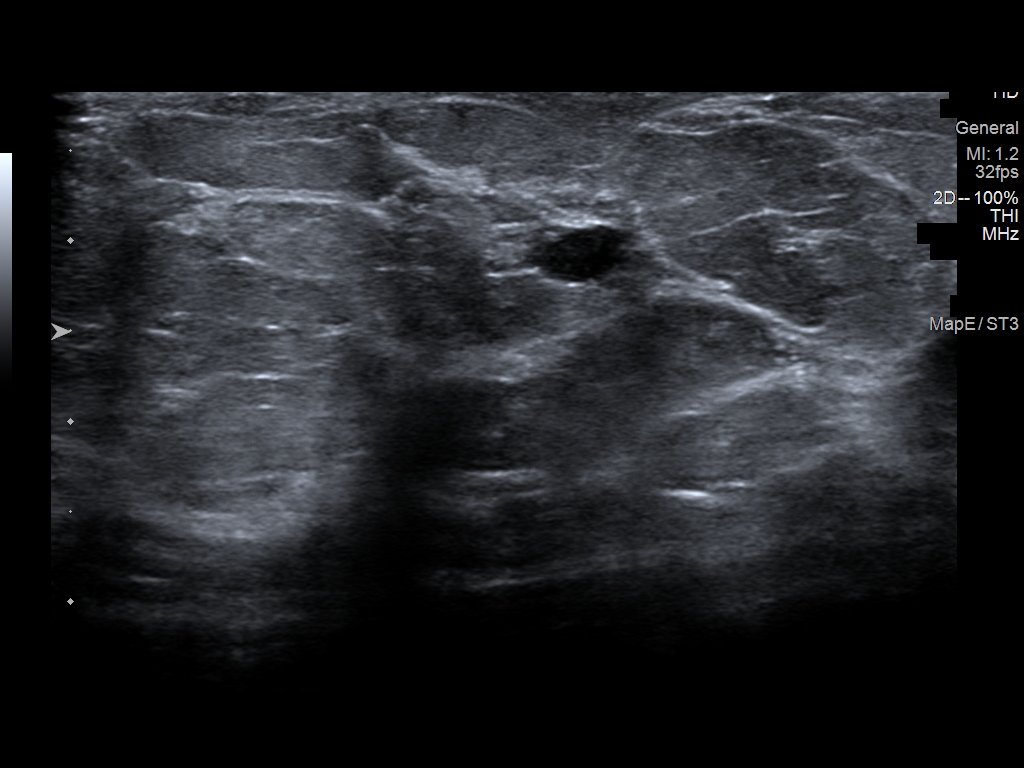

[6 of 6 positions shown; findings below may reference images not displayed]

ACR Breast Density Category b: There are scattered areas of
fibroglandular density.
FINDINGS: The possible mass, suspected in the lateral left breast on the
current screening exam, persists on the diagnostic images,
projecting near 9 o'clock. It measures 5-6 mm in long axis, is oval,
relatively lucent and circumscribed.

Targeted ultrasound is performed, showing an oval simple cyst in the
left breast at 9 o'clock, 3 cm the nipple, measuring 5 x 3 x 5 mm,
consistent in size, shape and location to the mammographic mass.
There are no solid masses or suspicious lesions.
IMPRESSION: 1. No evidence of breast malignancy.
2. Small benign cyst in the lateral left breast.

RECOMMENDATION:
Screening mammogram in one year.(Code:[id number])

I have discussed the findings and recommendations with the patient.
If applicable, a reminder letter will be sent to the patient
regarding the next appointment.

BI-RADS CATEGORY  2: Benign.

## 2022-08-28 ENCOUNTER — Other Ambulatory Visit: Payer: Self-pay | Admitting: Family Medicine

## 2022-08-28 NOTE — Telephone Encounter (Signed)
Last refill sent in 06/2022 for # 90 w/ 1 refill .Marland Kitchen Pt should have refills remaining

## 2022-09-06 ENCOUNTER — Encounter: Payer: Self-pay | Admitting: Family Medicine

## 2022-09-20 ENCOUNTER — Other Ambulatory Visit (INDEPENDENT_AMBULATORY_CARE_PROVIDER_SITE_OTHER): Payer: BC Managed Care – PPO

## 2022-09-20 DIAGNOSIS — E785 Hyperlipidemia, unspecified: Secondary | ICD-10-CM

## 2022-09-20 LAB — HEPATIC FUNCTION PANEL
ALT: 16 U/L (ref 0–35)
AST: 15 U/L (ref 0–37)
Albumin: 4.1 g/dL (ref 3.5–5.2)
Alkaline Phosphatase: 71 U/L (ref 39–117)
Bilirubin, Direct: 0.1 mg/dL (ref 0.0–0.3)
Total Bilirubin: 0.4 mg/dL (ref 0.2–1.2)
Total Protein: 6.8 g/dL (ref 6.0–8.3)

## 2022-09-22 ENCOUNTER — Telehealth: Payer: Self-pay

## 2022-09-22 NOTE — Telephone Encounter (Signed)
-----   Message from Sheliah Hatch, MD sent at 09/21/2022 11:16 AM EDT ----- Labs look great!  No changes at this time

## 2022-09-22 NOTE — Telephone Encounter (Signed)
Left results on pt VM  

## 2022-10-11 DIAGNOSIS — D225 Melanocytic nevi of trunk: Secondary | ICD-10-CM | POA: Diagnosis not present

## 2022-10-11 DIAGNOSIS — D235 Other benign neoplasm of skin of trunk: Secondary | ICD-10-CM | POA: Diagnosis not present

## 2022-10-11 DIAGNOSIS — L821 Other seborrheic keratosis: Secondary | ICD-10-CM | POA: Diagnosis not present

## 2022-10-11 DIAGNOSIS — L814 Other melanin hyperpigmentation: Secondary | ICD-10-CM | POA: Diagnosis not present

## 2022-10-21 ENCOUNTER — Other Ambulatory Visit: Payer: Self-pay | Admitting: Family Medicine

## 2022-10-23 NOTE — Telephone Encounter (Signed)
Xanax 0.5 mg LOV: 08/09/22 Last Refill:08/24/22 Upcoming appt: 02/09/23

## 2022-10-23 NOTE — Telephone Encounter (Signed)
Patient is requesting a refill of the following medications: Requested Prescriptions   Pending Prescriptions Disp Refills   ALPRAZolam (XANAX) 0.5 MG tablet [Pharmacy Med Name: alprazolam 0.5 mg tablet] 30 tablet 3    Sig: TAKE 1/2 TO 1 TABLET TWICE DAILY AS NEEDED FOR ANXIETY ATTACKS    Date of patient request: 10/23/22 Last office visit: 08/09/22 Date of last refill: 08/24/22 Last refill amount: 30 Follow up time period per chart: 1 year

## 2022-10-26 ENCOUNTER — Encounter: Payer: Self-pay | Admitting: Family Medicine

## 2022-10-26 DIAGNOSIS — R5383 Other fatigue: Secondary | ICD-10-CM

## 2022-10-27 ENCOUNTER — Other Ambulatory Visit: Payer: Self-pay | Admitting: Family Medicine

## 2022-11-08 ENCOUNTER — Telehealth: Payer: Self-pay | Admitting: Family Medicine

## 2022-11-08 NOTE — Telephone Encounter (Deleted)
Endocrinologist office called to say they don't see pts for fatigue diagnosis unless pt has abnormal labs to support a possible reason for fatigue. Due to pt having normal labs they will not see her.

## 2022-11-08 NOTE — Telephone Encounter (Signed)
Pt is the one who requested this particular referral/provider.  Please let pt know that this was their response.  If she wants to proceed, she will need to reach out to their office

## 2022-11-08 NOTE — Telephone Encounter (Signed)
I left pt a VM to return my call.

## 2022-11-08 NOTE — Telephone Encounter (Signed)
Endocrinologist office called to say they don't see pts for fatigue diagnosis unless pt has abnormal labs to support a possible reason for fatigue. Due to pt having normal labs they will not see her.

## 2022-11-08 NOTE — Telephone Encounter (Signed)
How would you like to proceed since they do not see pt for fatigued

## 2022-11-08 NOTE — Telephone Encounter (Signed)
Patient returned call and I informed her of the info regarding the endo referral. She said that she would reach out

## 2022-11-17 DIAGNOSIS — R1900 Intra-abdominal and pelvic swelling, mass and lump, unspecified site: Secondary | ICD-10-CM | POA: Diagnosis not present

## 2022-11-22 DIAGNOSIS — N852 Hypertrophy of uterus: Secondary | ICD-10-CM | POA: Diagnosis not present

## 2022-11-22 DIAGNOSIS — K5730 Diverticulosis of large intestine without perforation or abscess without bleeding: Secondary | ICD-10-CM | POA: Diagnosis not present

## 2022-11-22 DIAGNOSIS — K838 Other specified diseases of biliary tract: Secondary | ICD-10-CM | POA: Diagnosis not present

## 2022-11-22 DIAGNOSIS — R1900 Intra-abdominal and pelvic swelling, mass and lump, unspecified site: Secondary | ICD-10-CM | POA: Diagnosis not present

## 2022-11-22 DIAGNOSIS — I700 Atherosclerosis of aorta: Secondary | ICD-10-CM | POA: Diagnosis not present

## 2022-11-27 ENCOUNTER — Telehealth: Payer: Self-pay | Admitting: Family Medicine

## 2022-11-27 NOTE — Telephone Encounter (Signed)
At this time, we don't have any evidence of an endocrine issue and this is the 2nd practice to decline the referral.  Nothing further to do at this time

## 2022-11-27 NOTE — Telephone Encounter (Signed)
I have left pt a VM stating that they will not see her based on a fatigue only dx . What would you like to do next ?

## 2022-11-27 NOTE — Telephone Encounter (Signed)
[facility] stated that they can't see pt for fatigue as a diagnosis. She doesn't have any abnormal labs indicating there is an endocrine issue so they canceled the referral.

## 2022-11-29 ENCOUNTER — Other Ambulatory Visit: Payer: Self-pay | Admitting: Family Medicine

## 2022-12-04 DIAGNOSIS — R1900 Intra-abdominal and pelvic swelling, mass and lump, unspecified site: Secondary | ICD-10-CM | POA: Diagnosis not present

## 2022-12-04 DIAGNOSIS — K219 Gastro-esophageal reflux disease without esophagitis: Secondary | ICD-10-CM | POA: Diagnosis not present

## 2022-12-13 ENCOUNTER — Other Ambulatory Visit: Payer: Self-pay | Admitting: Family Medicine

## 2022-12-13 DIAGNOSIS — M797 Fibromyalgia: Secondary | ICD-10-CM

## 2022-12-14 NOTE — Telephone Encounter (Signed)
Patient is requesting a refill of the following medications: Requested Prescriptions   Pending Prescriptions Disp Refills   cyanocobalamin (VITAMIN B12) 1000 MCG/ML injection [Pharmacy Med Name: cyanocobalamin (vit B-12) 1,000 mcg/mL injection solution] 3 mL 6    Sig: INJECT 1 ML INTO THE MUSCLE EVERY 10 DAYS AS DIRECTED   meloxicam (MOBIC) 15 MG tablet [Pharmacy Med Name: meloxicam 15 mg tablet] 90 tablet 1    Sig: TAKE 1 TABLET BY MOUTH DAILY   fluticasone (FLONASE) 50 MCG/ACT nasal spray [Pharmacy Med Name: fluticasone propionate 50 mcg/actuation nasal spray,suspension] 16 g 1    Sig: USE 1 SPRAY INTO EACH NOSTRIL DAILY   pregabalin (LYRICA) 75 MG capsule [Pharmacy Med Name: pregabalin 75 mg capsule] 60 capsule 3    Sig: TAKE 1 CAPSULE BY MOUTH TWICE DAILY    Date of patient request: 12/14/22 Last office visit: 08/09/22 Date of last refill: 07/17/22, 10/27/22, 11/29/22 Last refill amount: 90 tab. 16 g, 60 cap Follow up time period per chart: 1 year

## 2022-12-20 DIAGNOSIS — Z6835 Body mass index (BMI) 35.0-35.9, adult: Secondary | ICD-10-CM | POA: Diagnosis not present

## 2022-12-20 DIAGNOSIS — Z01419 Encounter for gynecological examination (general) (routine) without abnormal findings: Secondary | ICD-10-CM | POA: Diagnosis not present

## 2022-12-20 DIAGNOSIS — Z1231 Encounter for screening mammogram for malignant neoplasm of breast: Secondary | ICD-10-CM | POA: Diagnosis not present

## 2023-01-28 ENCOUNTER — Other Ambulatory Visit: Payer: Self-pay | Admitting: Family Medicine

## 2023-02-09 ENCOUNTER — Encounter: Payer: Self-pay | Admitting: Family Medicine

## 2023-02-09 ENCOUNTER — Ambulatory Visit (INDEPENDENT_AMBULATORY_CARE_PROVIDER_SITE_OTHER): Payer: BC Managed Care – PPO | Admitting: Family Medicine

## 2023-02-09 VITALS — BP 118/80 | HR 71 | Temp 97.8°F | Ht 66.0 in | Wt 217.5 lb

## 2023-02-09 DIAGNOSIS — E538 Deficiency of other specified B group vitamins: Secondary | ICD-10-CM

## 2023-02-09 DIAGNOSIS — E785 Hyperlipidemia, unspecified: Secondary | ICD-10-CM

## 2023-02-09 DIAGNOSIS — E669 Obesity, unspecified: Secondary | ICD-10-CM

## 2023-02-09 DIAGNOSIS — F419 Anxiety disorder, unspecified: Secondary | ICD-10-CM

## 2023-02-09 LAB — LIPID PANEL
Cholesterol: 163 mg/dL (ref 0–200)
HDL: 39.2 mg/dL (ref >39.00)
LDL Cholesterol: 79 mg/dL (ref 0–99)
NonHDL: 124
Total CHOL/HDL Ratio: 4
Triglycerides: 224 mg/dL — ABNORMAL HIGH (ref 0.0–149.0)
VLDL: 44.8 mg/dL — ABNORMAL HIGH (ref 0.0–40.0)

## 2023-02-09 LAB — BASIC METABOLIC PANEL
BUN: 19 mg/dL (ref 6–23)
CO2: 30 meq/L (ref 19–32)
Calcium: 10.3 mg/dL (ref 8.4–10.5)
Chloride: 99 meq/L (ref 96–112)
Creatinine, Ser: 1.05 mg/dL (ref 0.40–1.20)
GFR: 59.79 mL/min — ABNORMAL LOW (ref >60.00)
Glucose, Bld: 96 mg/dL (ref 70–99)
Potassium: 4.3 meq/L (ref 3.5–5.1)
Sodium: 139 meq/L (ref 135–145)

## 2023-02-09 LAB — CBC WITH DIFFERENTIAL/PLATELET
Basophils Absolute: 0 10*3/uL (ref 0.0–0.1)
Basophils Relative: 0.4 % (ref 0.0–3.0)
Eosinophils Absolute: 0.3 10*3/uL (ref 0.0–0.7)
Eosinophils Relative: 3.2 % (ref 0.0–5.0)
HCT: 45.5 % (ref 36.0–46.0)
Hemoglobin: 14.9 g/dL (ref 12.0–15.0)
Lymphocytes Relative: 25.6 % (ref 12.0–46.0)
Lymphs Abs: 2.5 10*3/uL (ref 0.7–4.0)
MCHC: 32.8 g/dL (ref 30.0–36.0)
MCV: 86.2 fL (ref 78.0–100.0)
Monocytes Absolute: 0.6 10*3/uL (ref 0.1–1.0)
Monocytes Relative: 5.8 % (ref 3.0–12.0)
Neutro Abs: 6.5 10*3/uL (ref 1.4–7.7)
Neutrophils Relative %: 65 % (ref 43.0–77.0)
Platelets: 309 10*3/uL (ref 150.0–400.0)
RBC: 5.28 Mil/uL — ABNORMAL HIGH (ref 3.87–5.11)
RDW: 14.9 % (ref 11.5–15.5)
WBC: 9.9 10*3/uL (ref 4.0–10.5)

## 2023-02-09 LAB — HEPATIC FUNCTION PANEL
ALT: 15 U/L (ref 0–35)
AST: 17 U/L (ref 0–37)
Albumin: 4.5 g/dL (ref 3.5–5.2)
Alkaline Phosphatase: 78 U/L (ref 39–117)
Bilirubin, Direct: 0 mg/dL (ref 0.0–0.3)
Total Bilirubin: 0.5 mg/dL (ref 0.2–1.2)
Total Protein: 7.4 g/dL (ref 6.0–8.3)

## 2023-02-09 LAB — TSH: TSH: 2.12 u[IU]/mL (ref 0.35–5.50)

## 2023-02-09 LAB — VITAMIN B12: Vitamin B-12: 554 pg/mL (ref 211–911)

## 2023-02-09 NOTE — Patient Instructions (Signed)
Schedule your complete physical in 6 months We'll notify you of your lab results and make any changes if needed Give yourself some grace and time to heal! When you are ready, get back to healthy diet and regular exercise Call with any questions or concerns Stay Safe!  Stay Healthy! Hang in there!!

## 2023-02-09 NOTE — Assessment & Plan Note (Signed)
Ongoing issue for pt.  Struggling w/ grief right now w/ loss of father in law.  Encouraged her to seek grief counseling through Hospice.  Continue Sertraline and Alprazolam which pt states she is thankful for.

## 2023-02-09 NOTE — Progress Notes (Signed)
   Subjective:    Patient ID: Jacqueline Mcfarland, female    DOB: [birth year], 57 y.o.   MRN: [id number]  HPI Hyperlipidemia- chronic problem, on Crestor 10mg  daily.  No CP, SOB, abd pain, N/V.  Obesity- ongoing issue for pt.  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  Not following a particular diet.  Anxiety- recently lost father in law.  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  She is having a really hard time.  Is thankful for the Sertraline and Alprazolam.   Review of Systems For ROS see HPI     Objective:   Physical Exam Vitals reviewed.  Constitutional:      General: She is not in acute distress.    Appearance: Normal appearance. She is well-developed. She is obese. She is not ill-appearing.  HENT:     Head: Normocephalic and atraumatic.  Eyes:     Conjunctiva/sclera: Conjunctivae normal.     Pupils: Pupils are equal, round, and reactive to light.  Neck:     Thyroid: No thyromegaly.  Cardiovascular:     Rate and Rhythm: Normal rate and regular rhythm.     Heart sounds: Normal heart sounds. No murmur heard. Pulmonary:     Effort: Pulmonary effort is normal. No respiratory distress.     Breath sounds: Normal breath sounds.  Abdominal:     General: There is no distension.     Palpations: Abdomen is soft.     Tenderness: There is no abdominal tenderness.  Musculoskeletal:     Cervical back: Normal range of motion and neck supple.  Lymphadenopathy:     Cervical: No cervical adenopathy.  Skin:    General: Skin is warm and dry.  Neurological:     General: No focal deficit present.     Mental Status: She is alert and oriented to person, place, and time.  Psychiatric:        Behavior: Behavior normal.     Comments: Tearful when talking about her loss           Assessment & Plan:

## 2023-02-09 NOTE — Assessment & Plan Note (Signed)
Ongoing issue for pt.  Weight is stable.  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  Not following a particular diet at this time.  Encouraged her to focus on low carb diet and regular physical activity once she has had time to process her loss.  Will follow.

## 2023-02-09 NOTE — Assessment & Plan Note (Signed)
Check labs and replete prn.

## 2023-02-09 NOTE — Assessment & Plan Note (Signed)
Chronic problem.  Currently on Crestor 10mg daily w/o difficulty.  Check labs.  Adjust meds prn  

## 2023-02-12 ENCOUNTER — Telehealth: Payer: Self-pay

## 2023-02-12 NOTE — Telephone Encounter (Signed)
Pt has reviewed via MyChart

## 2023-02-12 NOTE — Telephone Encounter (Signed)
-----   Message from Neena Rhymes sent at 02/12/2023  7:40 AM EST ----- Labs are stable and look good!  No changes at this time

## 2023-02-14 ENCOUNTER — Other Ambulatory Visit: Payer: Self-pay | Admitting: Family Medicine

## 2023-02-14 NOTE — Telephone Encounter (Signed)
Requested Prescriptions   Pending Prescriptions Disp Refills   ALPRAZolam (XANAX) 0.5 MG tablet [Pharmacy Med Name: alprazolam 0.5 mg tablet] 30 tablet 3    Sig: TAKE 1/2 TO 1 TABLET TWICE DAILY AS NEEDED FOR ANXIETY ATTACKS     Date of patient request: 02/14/2023 Last office visit: 02/09/2023 Upcoming visit: 08/10/2023 Date of last refill: 10/23/2022 Last refill amount: 30 with 3 refills

## 2023-03-22 DIAGNOSIS — J208 Acute bronchitis due to other specified organisms: Secondary | ICD-10-CM | POA: Diagnosis not present

## 2023-03-22 DIAGNOSIS — B9689 Other specified bacterial agents as the cause of diseases classified elsewhere: Secondary | ICD-10-CM | POA: Diagnosis not present

## 2023-04-05 ENCOUNTER — Other Ambulatory Visit: Payer: Self-pay | Admitting: Family Medicine

## 2023-04-09 ENCOUNTER — Other Ambulatory Visit: Payer: Self-pay | Admitting: Family Medicine

## 2023-04-09 DIAGNOSIS — E785 Hyperlipidemia, unspecified: Secondary | ICD-10-CM

## 2023-04-17 ENCOUNTER — Other Ambulatory Visit: Payer: Self-pay | Admitting: Family Medicine

## 2023-05-17 ENCOUNTER — Other Ambulatory Visit: Payer: Self-pay | Admitting: Family Medicine

## 2023-05-17 DIAGNOSIS — M797 Fibromyalgia: Secondary | ICD-10-CM

## 2023-05-17 NOTE — Telephone Encounter (Signed)
 Requested Prescriptions   Pending Prescriptions Disp Refills   meloxicam (MOBIC) 15 MG tablet [Pharmacy Med Name: meloxicam 15 mg tablet] 90 tablet 1    Sig: TAKE 1 TABLET BY MOUTH ONCE DAILY     Date of patient request: 05/17/2023 Last office visit: 02/09/2023 Upcoming visit: 08/10/2023 Date of last refill: 12/14/2022 Last refill amount: 90x1

## 2023-05-22 DIAGNOSIS — M4317 Spondylolisthesis, lumbosacral region: Secondary | ICD-10-CM | POA: Diagnosis not present

## 2023-05-22 DIAGNOSIS — M7062 Trochanteric bursitis, left hip: Secondary | ICD-10-CM | POA: Diagnosis not present

## 2023-05-22 DIAGNOSIS — M25552 Pain in left hip: Secondary | ICD-10-CM | POA: Diagnosis not present

## 2023-05-22 DIAGNOSIS — M51379 Other intervertebral disc degeneration, lumbosacral region without mention of lumbar back pain or lower extremity pain: Secondary | ICD-10-CM | POA: Diagnosis not present

## 2023-05-31 DIAGNOSIS — J019 Acute sinusitis, unspecified: Secondary | ICD-10-CM | POA: Diagnosis not present

## 2023-06-03 DIAGNOSIS — M51379 Other intervertebral disc degeneration, lumbosacral region without mention of lumbar back pain or lower extremity pain: Secondary | ICD-10-CM | POA: Diagnosis not present

## 2023-06-03 DIAGNOSIS — M4317 Spondylolisthesis, lumbosacral region: Secondary | ICD-10-CM | POA: Diagnosis not present

## 2023-06-03 DIAGNOSIS — M47817 Spondylosis without myelopathy or radiculopathy, lumbosacral region: Secondary | ICD-10-CM | POA: Diagnosis not present

## 2023-06-03 DIAGNOSIS — M4696 Unspecified inflammatory spondylopathy, lumbar region: Secondary | ICD-10-CM | POA: Diagnosis not present

## 2023-06-03 DIAGNOSIS — M47816 Spondylosis without myelopathy or radiculopathy, lumbar region: Secondary | ICD-10-CM | POA: Diagnosis not present

## 2023-06-03 DIAGNOSIS — M4316 Spondylolisthesis, lumbar region: Secondary | ICD-10-CM | POA: Diagnosis not present

## 2023-06-05 ENCOUNTER — Other Ambulatory Visit: Payer: Self-pay | Admitting: Family Medicine

## 2023-06-05 NOTE — Telephone Encounter (Signed)
 Requested Prescriptions   Pending Prescriptions Disp Refills   methocarbamol (ROBAXIN) 500 MG tablet [Pharmacy Med Name: methocarbamol 500 mg tablet] 90 tablet 1    Sig: TAKE 1 TABLET BY MOUTH EVERY 8 HOURS AS NEEDED     Date of patient request: 06/05/2023 Last office visit: 02/09/2023 Upcoming visit: 08/10/2023 Date of last refill: 04/10/23 Last refill amount: 90

## 2023-06-14 DIAGNOSIS — M4317 Spondylolisthesis, lumbosacral region: Secondary | ICD-10-CM | POA: Diagnosis not present

## 2023-06-14 DIAGNOSIS — M545 Low back pain, unspecified: Secondary | ICD-10-CM | POA: Diagnosis not present

## 2023-06-14 DIAGNOSIS — G8929 Other chronic pain: Secondary | ICD-10-CM | POA: Diagnosis not present

## 2023-06-14 DIAGNOSIS — M51379 Other intervertebral disc degeneration, lumbosacral region without mention of lumbar back pain or lower extremity pain: Secondary | ICD-10-CM | POA: Diagnosis not present

## 2023-07-08 ENCOUNTER — Other Ambulatory Visit: Payer: Self-pay | Admitting: Family Medicine

## 2023-07-09 ENCOUNTER — Other Ambulatory Visit: Payer: Self-pay | Admitting: Family Medicine

## 2023-07-09 NOTE — Telephone Encounter (Signed)
 Please advise on refill  Last check 02/09/2023

## 2023-07-10 DIAGNOSIS — M4317 Spondylolisthesis, lumbosacral region: Secondary | ICD-10-CM | POA: Diagnosis not present

## 2023-07-10 DIAGNOSIS — M51362 Other intervertebral disc degeneration, lumbar region with discogenic back pain and lower extremity pain: Secondary | ICD-10-CM | POA: Diagnosis not present

## 2023-07-10 DIAGNOSIS — M5416 Radiculopathy, lumbar region: Secondary | ICD-10-CM | POA: Diagnosis not present

## 2023-07-10 DIAGNOSIS — M4310 Spondylolisthesis, site unspecified: Secondary | ICD-10-CM | POA: Diagnosis not present

## 2023-07-18 DIAGNOSIS — M5451 Vertebrogenic low back pain: Secondary | ICD-10-CM | POA: Diagnosis not present

## 2023-07-18 DIAGNOSIS — M5442 Lumbago with sciatica, left side: Secondary | ICD-10-CM | POA: Diagnosis not present

## 2023-07-20 ENCOUNTER — Other Ambulatory Visit: Payer: Self-pay | Admitting: Family Medicine

## 2023-07-20 DIAGNOSIS — M5442 Lumbago with sciatica, left side: Secondary | ICD-10-CM | POA: Diagnosis not present

## 2023-07-20 DIAGNOSIS — M5451 Vertebrogenic low back pain: Secondary | ICD-10-CM | POA: Diagnosis not present

## 2023-07-25 DIAGNOSIS — M5451 Vertebrogenic low back pain: Secondary | ICD-10-CM | POA: Diagnosis not present

## 2023-07-25 DIAGNOSIS — M5442 Lumbago with sciatica, left side: Secondary | ICD-10-CM | POA: Diagnosis not present

## 2023-07-31 ENCOUNTER — Other Ambulatory Visit: Payer: Self-pay | Admitting: Family Medicine

## 2023-08-08 DIAGNOSIS — M5442 Lumbago with sciatica, left side: Secondary | ICD-10-CM | POA: Diagnosis not present

## 2023-08-10 ENCOUNTER — Encounter: Payer: BC Managed Care – PPO | Admitting: Family Medicine

## 2023-08-13 DIAGNOSIS — J019 Acute sinusitis, unspecified: Secondary | ICD-10-CM | POA: Diagnosis not present

## 2023-08-21 DIAGNOSIS — M48062 Spinal stenosis, lumbar region with neurogenic claudication: Secondary | ICD-10-CM | POA: Diagnosis not present

## 2023-08-21 DIAGNOSIS — M4300 Spondylolysis, site unspecified: Secondary | ICD-10-CM | POA: Diagnosis not present

## 2023-09-07 ENCOUNTER — Encounter: Admitting: Family Medicine

## 2023-09-10 ENCOUNTER — Telehealth: Payer: Self-pay | Admitting: Family Medicine

## 2023-09-10 NOTE — Telephone Encounter (Signed)
 Type of form received: Surgical Clearance  Additional comments: Patient scheduled for 7/16  Received by: Fax  Form should be Faxed/mailed to: (address/ fax #) [phone]  Is patient requesting call for pickup: N/A  Form placed:  Labeled & placed in provider bin  Attach charge sheet.  Provider will determine charge.  Individual made aware of 3-5 business day turn around? No

## 2023-09-11 NOTE — Telephone Encounter (Signed)
 I have her surgical clearance form

## 2023-09-26 ENCOUNTER — Encounter: Payer: Self-pay | Admitting: Family Medicine

## 2023-09-26 ENCOUNTER — Ambulatory Visit: Admitting: Family Medicine

## 2023-09-26 ENCOUNTER — Other Ambulatory Visit: Payer: Self-pay | Admitting: Family Medicine

## 2023-09-26 VITALS — BP 120/76 | HR 78 | Temp 98.1°F | Ht 66.0 in | Wt 220.1 lb

## 2023-09-26 DIAGNOSIS — Z0000 Encounter for general adult medical examination without abnormal findings: Secondary | ICD-10-CM

## 2023-09-26 DIAGNOSIS — Z01818 Encounter for other preprocedural examination: Secondary | ICD-10-CM

## 2023-09-26 DIAGNOSIS — E559 Vitamin D deficiency, unspecified: Secondary | ICD-10-CM

## 2023-09-26 DIAGNOSIS — R300 Dysuria: Secondary | ICD-10-CM | POA: Diagnosis not present

## 2023-09-26 DIAGNOSIS — E785 Hyperlipidemia, unspecified: Secondary | ICD-10-CM

## 2023-09-26 DIAGNOSIS — E538 Deficiency of other specified B group vitamins: Secondary | ICD-10-CM | POA: Diagnosis not present

## 2023-09-26 LAB — LIPID PANEL
Cholesterol: 181 mg/dL (ref 0–200)
HDL: 42.4 mg/dL (ref >39.00)
LDL Cholesterol: 87 mg/dL (ref 0–99)
NonHDL: 138.42
Total CHOL/HDL Ratio: 4
Triglycerides: 256 mg/dL — ABNORMAL HIGH (ref 0.0–149.0)
VLDL: 51.2 mg/dL — ABNORMAL HIGH (ref 0.0–40.0)

## 2023-09-26 LAB — CBC WITH DIFFERENTIAL/PLATELET
Basophils Absolute: 0.1 K/uL (ref 0.0–0.1)
Basophils Relative: 0.9 % (ref 0.0–3.0)
Eosinophils Absolute: 0.4 K/uL (ref 0.0–0.7)
Eosinophils Relative: 3.9 % (ref 0.0–5.0)
HCT: 44 % (ref 36.0–46.0)
Hemoglobin: 14.3 g/dL (ref 12.0–15.0)
Lymphocytes Relative: 30.8 % (ref 12.0–46.0)
Lymphs Abs: 2.8 K/uL (ref 0.7–4.0)
MCHC: 32.5 g/dL (ref 30.0–36.0)
MCV: 84.2 fl (ref 78.0–100.0)
Monocytes Absolute: 0.6 K/uL (ref 0.1–1.0)
Monocytes Relative: 6.2 % (ref 3.0–12.0)
Neutro Abs: 5.3 K/uL (ref 1.4–7.7)
Neutrophils Relative %: 58.2 % (ref 43.0–77.0)
Platelets: 303 K/uL (ref 150.0–400.0)
RBC: 5.23 Mil/uL — ABNORMAL HIGH (ref 3.87–5.11)
RDW: 15.2 % (ref 11.5–15.5)
WBC: 9.1 K/uL (ref 4.0–10.5)

## 2023-09-26 LAB — BASIC METABOLIC PANEL WITH GFR
BUN: 20 mg/dL (ref 6–23)
CO2: 29 meq/L (ref 19–32)
Calcium: 9.6 mg/dL (ref 8.4–10.5)
Chloride: 102 meq/L (ref 96–112)
Creatinine, Ser: 0.98 mg/dL (ref 0.40–1.20)
GFR: 64.67 mL/min (ref >60.00)
Glucose, Bld: 96 mg/dL (ref 70–99)
Potassium: 4.4 meq/L (ref 3.5–5.1)
Sodium: 140 meq/L (ref 135–145)

## 2023-09-26 LAB — B12 AND FOLATE PANEL
Folate: 15 ng/mL (ref >5.9)
Vitamin B-12: 402 pg/mL (ref 211–911)

## 2023-09-26 LAB — POCT URINALYSIS DIPSTICK
Bilirubin, UA: NEGATIVE
Glucose, UA: NEGATIVE
Ketones, UA: NEGATIVE
Leukocytes, UA: NEGATIVE
Nitrite, UA: NEGATIVE
Protein, UA: POSITIVE — AB
Spec Grav, UA: 1.015 (ref 1.010–1.025)
Urobilinogen, UA: 0.2 U/dL
pH, UA: 6 (ref 5.0–8.0)

## 2023-09-26 LAB — TSH: TSH: 1.92 u[IU]/mL (ref 0.35–5.50)

## 2023-09-26 LAB — HEPATIC FUNCTION PANEL
ALT: 11 U/L (ref 0–35)
AST: 12 U/L (ref 0–37)
Albumin: 4.2 g/dL (ref 3.5–5.2)
Alkaline Phosphatase: 67 U/L (ref 39–117)
Bilirubin, Direct: 0.1 mg/dL (ref 0.0–0.3)
Total Bilirubin: 0.4 mg/dL (ref 0.2–1.2)
Total Protein: 6.4 g/dL (ref 6.0–8.3)

## 2023-09-26 LAB — VITAMIN D 25 HYDROXY (VIT D DEFICIENCY, FRACTURES): VITD: 36.19 ng/mL (ref 30.00–100.00)

## 2023-09-26 NOTE — Patient Instructions (Signed)
 Follow up in 6 months to recheck cholesterol We'll notify you of your lab results and make any changes if needed HOLD any anti-inflammatories (ibuprofen, Aleve, Meloxicam , etc) for at least 7 days before surgery Continue to work on healthy diet and regular exercise- you can do it! Call with any questions or concerns Stay Safe!  Stay Healthy! GOOD LUCK WITH SURGERY!!!

## 2023-09-26 NOTE — Assessment & Plan Note (Signed)
 Pt's PE WNL w/ exception of BMI.  UTD on pap, mammo, cologuard, Tdap.  Check labs.  EKG done for surgical clearance- ok to proceed.  Anticipatory guidance provided.

## 2023-09-26 NOTE — Progress Notes (Signed)
   Subjective:    Patient ID: Jacqueline Mcfarland, female    DOB: 04-01-1965, 58 y.o.   MRN: [id number]  HPI CPE- UTD on mammo (requesting records), pap, cologuard, Tdap.  Patient Care Team    Relationship Specialty Notifications Start End  Mahlon Comer BRAVO, MD PCP - General Family Medicine  10/02/16   Sarrah Browning, MD Consulting Physician Obstetrics and Gynecology  12/20/18   Imaging, [facility]  Diagnostic Radiology  09/26/23      Health Maintenance  Topic Date Due   Hepatitis B Vaccines (1 of 3 - 19+ 3-dose series) Never done   Zoster Vaccines- Shingrix (1 of 2) Never done   MAMMOGRAM  10/07/2022   COVID-19 Vaccine (4 - 2024-25 season) 11/19/2022   INFLUENZA VACCINE  10/19/2023   Cervical Cancer Screening (HPV/Pap Cotest)  09/29/2024   Fecal DNA (Cologuard)  04/13/2025   DTaP/Tdap/Td (2 - Td or Tdap) 10/02/2025   Hepatitis C Screening  Completed   HIV Screening  Completed   HPV VACCINES  Aged Out   Meningococcal B Vaccine  Aged Out      Review of Systems Patient reports no vision/ hearing changes, adenopathy,fever, weight change,  persistant/recurrent hoarseness , swallowing issues, chest pain, palpitations, edema, persistant/recurrent cough, hemoptysis, dyspnea (rest/exertional/paroxysmal nocturnal), gastrointestinal bleeding (melena, rectal bleeding), abdominal pain, significant heartburn, bowel changes, GU symptoms (dysuria, hematuria, incontinence), Gyn symptoms (abnormal  bleeding, pain),  syncope, focal weakness, memory loss, numbness & tingling, skin/hair/nail changes, abnormal bruising or bleeding, anxiety, or depression.     Objective:   Physical Exam General Appearance:    Alert, cooperative, no distress, appears stated age, obese  Head:    Normocephalic, without obvious abnormality, atraumatic  Eyes:    PERRL, conjunctiva/corneas clear, EOM's intact both eyes  Ears:    Normal TM's and external ear canals, both ears  Nose:   Nares normal, septum  midline, mucosa normal, no drainage    or sinus tenderness  Throat:   Lips, mucosa, and tongue normal; teeth and gums normal  Neck:   Supple, symmetrical, trachea midline, no adenopathy;    Thyroid : no enlargement/tenderness/nodules  Back:     Symmetric, no curvature, ROM normal, no CVA tenderness  Lungs:     Clear to auscultation bilaterally, respirations unlabored  Chest Wall:    No tenderness or deformity   Heart:    Regular rate and rhythm, S1 and S2 normal, no murmur, rub   or gallop  Breast Exam:    Deferred to GYN  Abdomen:     Soft, non-tender, bowel sounds active all four quadrants,    no masses, no organomegaly  Genitalia:    Deferred to GYN  Rectal:    Extremities:   Extremities normal, atraumatic, no cyanosis or edema  Pulses:   2+ and symmetric all extremities  Skin:   Skin color, texture, turgor normal, no rashes or lesions  Lymph nodes:   Cervical, supraclavicular, and axillary nodes normal  Neurologic:   CNII-XII intact, normal strength, sensation and reflexes    throughout          Assessment & Plan:

## 2023-09-27 LAB — APTT: aPTT: 28 s (ref 24–33)

## 2023-09-27 LAB — URINE CULTURE
MICRO NUMBER:: 16677388
Result:: NO GROWTH
SPECIMEN QUALITY:: ADEQUATE

## 2023-10-02 ENCOUNTER — Ambulatory Visit: Payer: Self-pay | Admitting: Family Medicine

## 2023-10-02 NOTE — Progress Notes (Signed)
 Pt has reviewed via MyChart

## 2023-10-03 ENCOUNTER — Ambulatory Visit: Admitting: Family Medicine

## 2023-10-03 ENCOUNTER — Ambulatory Visit: Admitting: Student in an Organized Health Care Education/Training Program

## 2023-10-17 DIAGNOSIS — D235 Other benign neoplasm of skin of trunk: Secondary | ICD-10-CM | POA: Diagnosis not present

## 2023-10-17 DIAGNOSIS — L821 Other seborrheic keratosis: Secondary | ICD-10-CM | POA: Diagnosis not present

## 2023-10-17 DIAGNOSIS — D225 Melanocytic nevi of trunk: Secondary | ICD-10-CM | POA: Diagnosis not present

## 2023-10-17 DIAGNOSIS — L814 Other melanin hyperpigmentation: Secondary | ICD-10-CM | POA: Diagnosis not present

## 2023-10-29 ENCOUNTER — Other Ambulatory Visit: Payer: Self-pay | Admitting: Family Medicine

## 2023-10-29 DIAGNOSIS — E785 Hyperlipidemia, unspecified: Secondary | ICD-10-CM

## 2023-10-29 NOTE — Telephone Encounter (Signed)
 Requested Prescriptions   Pending Prescriptions Disp Refills   rosuvastatin  (CRESTOR ) 10 MG tablet [Pharmacy Med Name: rosuvastatin  10 mg tablet] 30 tablet 6    Sig: TAKE 1 TABLET BY MOUTH DAILY   ALPRAZolam  (XANAX ) 0.5 MG tablet [Pharmacy Med Name: alprazolam  0.5 mg tablet] 30 tablet 3    Sig: TAKE 1/2 TO 1 TABLET TWICE DAILY AS NEEDED FOR ANXIETY ATTACKS     Date of patient request: 10/29/2023 Last office visit: 09/26/2023 Upcoming visit: 03/28/2024 Date of last refill: 04/09/2023, 07/09/2023 Last refill amount: 30

## 2023-10-30 DIAGNOSIS — M4300 Spondylolysis, site unspecified: Secondary | ICD-10-CM | POA: Diagnosis not present

## 2023-11-01 DIAGNOSIS — G589 Mononeuropathy, unspecified: Secondary | ICD-10-CM | POA: Diagnosis not present

## 2023-11-01 DIAGNOSIS — M48061 Spinal stenosis, lumbar region without neurogenic claudication: Secondary | ICD-10-CM | POA: Diagnosis not present

## 2023-11-01 DIAGNOSIS — Z981 Arthrodesis status: Secondary | ICD-10-CM | POA: Diagnosis not present

## 2023-11-01 DIAGNOSIS — M4316 Spondylolisthesis, lumbar region: Secondary | ICD-10-CM | POA: Diagnosis not present

## 2023-11-01 DIAGNOSIS — G709 Myoneural disorder, unspecified: Secondary | ICD-10-CM | POA: Diagnosis not present

## 2023-11-01 DIAGNOSIS — K625 Hemorrhage of anus and rectum: Secondary | ICD-10-CM | POA: Diagnosis not present

## 2023-11-01 DIAGNOSIS — Z9049 Acquired absence of other specified parts of digestive tract: Secondary | ICD-10-CM | POA: Diagnosis not present

## 2023-11-01 DIAGNOSIS — J9811 Atelectasis: Secondary | ICD-10-CM | POA: Diagnosis not present

## 2023-11-01 DIAGNOSIS — M419 Scoliosis, unspecified: Secondary | ICD-10-CM | POA: Diagnosis not present

## 2023-11-01 DIAGNOSIS — M4317 Spondylolisthesis, lumbosacral region: Secondary | ICD-10-CM | POA: Diagnosis not present

## 2023-11-01 DIAGNOSIS — M48062 Spinal stenosis, lumbar region with neurogenic claudication: Secondary | ICD-10-CM | POA: Diagnosis not present

## 2023-11-01 DIAGNOSIS — N390 Urinary tract infection, site not specified: Secondary | ICD-10-CM | POA: Diagnosis not present

## 2023-11-01 HISTORY — PX: BACK SURGERY: SHX140

## 2023-11-02 DIAGNOSIS — Z9049 Acquired absence of other specified parts of digestive tract: Secondary | ICD-10-CM | POA: Diagnosis not present

## 2023-11-05 ENCOUNTER — Telehealth: Payer: Self-pay

## 2023-11-05 DIAGNOSIS — K625 Hemorrhage of anus and rectum: Secondary | ICD-10-CM | POA: Diagnosis not present

## 2023-11-05 NOTE — Telephone Encounter (Signed)
 Yes, we can sign HH orders if needed

## 2023-11-05 NOTE — Telephone Encounter (Signed)
 Copied from CRM [id number]. Topic: General - Other >> Nov 05, 2023  9:42 AM Franky GRADE wrote: Reason for CRM: [facility] were calling to confirm if patient was active with the practice, as they are discharging patient and would like to know if Dr.Tabori would be willing to sign for the home health they are referring patient to.

## 2023-11-05 NOTE — Telephone Encounter (Signed)
 Called Jacqueline Mcfarland back to let her know that Dr. Mahlon will sign HH orders if needed.   Number to reach [facility] in [place] that patient will receive services - [phone] [facility] location.   Copied from CRM [id number]. Topic: General - Other >> Nov 05, 2023  2:07 PM Jacqueline Mcfarland wrote: Reason for CRM: Patient was referred by the hospital to [facility] And Jacqueline Mcfarland would like to know if provider will continue to follow patient plan of care order with [facility].    Elena-Office number: [phone]

## 2023-11-05 NOTE — Telephone Encounter (Signed)
 Noted in Chart. No call back number

## 2023-12-11 ENCOUNTER — Other Ambulatory Visit: Payer: Self-pay | Admitting: Family Medicine

## 2023-12-11 DIAGNOSIS — M797 Fibromyalgia: Secondary | ICD-10-CM

## 2023-12-23 ENCOUNTER — Other Ambulatory Visit: Payer: Self-pay | Admitting: Family Medicine

## 2024-01-28 ENCOUNTER — Other Ambulatory Visit: Payer: Self-pay | Admitting: Family Medicine

## 2024-02-01 ENCOUNTER — Encounter: Payer: Self-pay | Admitting: Family Medicine

## 2024-02-01 MED ORDER — SERTRALINE HCL 100 MG PO TABS: 100.0000 mg | ORAL_TABLET | Freq: Every day | ORAL | 3 refills | Status: DC

## 2024-02-01 MED ORDER — ALPRAZOLAM 0.5 MG PO TABS: ORAL_TABLET | ORAL | 3 refills | Status: DC

## 2024-02-01 NOTE — Telephone Encounter (Signed)
 Requesting: alprazolam  0.5mg  Contract: UDS: Last Visit: 09/26/23 Next Visit: 03/28/24 Last Refill: 10/29/23 #30 and 3RF  Please Advise

## 2024-02-05 DIAGNOSIS — M7671 Peroneal tendinitis, right leg: Secondary | ICD-10-CM | POA: Diagnosis not present

## 2024-02-05 DIAGNOSIS — M79671 Pain in right foot: Secondary | ICD-10-CM | POA: Diagnosis not present

## 2024-02-20 ENCOUNTER — Other Ambulatory Visit: Payer: Self-pay | Admitting: Family Medicine

## 2024-02-20 DIAGNOSIS — E785 Hyperlipidemia, unspecified: Secondary | ICD-10-CM

## 2024-02-20 DIAGNOSIS — Z9889 Other specified postprocedural states: Secondary | ICD-10-CM | POA: Diagnosis not present

## 2024-02-20 DIAGNOSIS — M4300 Spondylolysis, site unspecified: Secondary | ICD-10-CM | POA: Diagnosis not present

## 2024-02-26 DIAGNOSIS — M4300 Spondylolysis, site unspecified: Secondary | ICD-10-CM | POA: Diagnosis not present

## 2024-03-27 ENCOUNTER — Other Ambulatory Visit: Payer: Self-pay | Admitting: Family Medicine

## 2024-03-27 NOTE — Telephone Encounter (Signed)
 Requested Prescriptions   Pending Prescriptions Disp Refills   ALPRAZolam  (XANAX ) 0.5 MG tablet [Pharmacy Med Name: alprazolam  0.5 mg tablet] 30 tablet 3    Sig: TAKE 1/2 TO 1 TABLET BY MOUTH TWICE DAILY AS NEEDED ANXIETY ATTACKS     Date of patient request: 03/27/2024 Last office visit: 09/26/2023 Upcoming visit: 04/03/2024 Date of last refill: 02/01/2024 Last refill amount: 30x3

## 2024-03-27 NOTE — Telephone Encounter (Signed)
 Called patient and Jacqueline Mcfarland. Please relay Dr. Charis message if patient calls back.

## 2024-03-27 NOTE — Telephone Encounter (Signed)
 Please let pt know that this is to be used as needed and not as an every day, twice daily medication.  Only for high anxiety/panicked moments

## 2024-03-28 ENCOUNTER — Ambulatory Visit: Admitting: Family Medicine

## 2024-04-03 ENCOUNTER — Ambulatory Visit (INDEPENDENT_AMBULATORY_CARE_PROVIDER_SITE_OTHER): Admitting: Family Medicine

## 2024-04-03 ENCOUNTER — Encounter: Payer: Self-pay | Admitting: Family Medicine

## 2024-04-03 VITALS — BP 130/84 | HR 77 | Temp 99.1°F | Ht 66.0 in | Wt 224.0 lb

## 2024-04-03 DIAGNOSIS — E785 Hyperlipidemia, unspecified: Secondary | ICD-10-CM | POA: Diagnosis not present

## 2024-04-03 DIAGNOSIS — Z1231 Encounter for screening mammogram for malignant neoplasm of breast: Secondary | ICD-10-CM | POA: Diagnosis not present

## 2024-04-03 DIAGNOSIS — M797 Fibromyalgia: Secondary | ICD-10-CM | POA: Diagnosis not present

## 2024-04-03 DIAGNOSIS — R030 Elevated blood-pressure reading, without diagnosis of hypertension: Secondary | ICD-10-CM | POA: Diagnosis not present

## 2024-04-03 LAB — CBC WITH DIFFERENTIAL/PLATELET
Basophils Absolute: 0.1 K/uL (ref 0.0–0.1)
Basophils Relative: 0.6 % (ref 0.0–3.0)
Eosinophils Absolute: 0.4 K/uL (ref 0.0–0.7)
Eosinophils Relative: 4.5 % (ref 0.0–5.0)
HCT: 43.6 % (ref 36.0–46.0)
Hemoglobin: 14.4 g/dL (ref 12.0–15.0)
Lymphocytes Relative: 28.1 % (ref 12.0–46.0)
Lymphs Abs: 2.6 K/uL (ref 0.7–4.0)
MCHC: 33.1 g/dL (ref 30.0–36.0)
MCV: 80 fl (ref 78.0–100.0)
Monocytes Absolute: 0.7 K/uL (ref 0.1–1.0)
Monocytes Relative: 7.5 % (ref 3.0–12.0)
Neutro Abs: 5.6 K/uL (ref 1.4–7.7)
Neutrophils Relative %: 59.3 % (ref 43.0–77.0)
Platelets: 317 K/uL (ref 150.0–400.0)
RBC: 5.44 Mil/uL — ABNORMAL HIGH (ref 3.87–5.11)
RDW: 16.2 % — ABNORMAL HIGH (ref 11.5–15.5)
WBC: 9.4 K/uL (ref 4.0–10.5)

## 2024-04-03 LAB — HEPATIC FUNCTION PANEL
ALT: 11 U/L (ref 3–35)
AST: 12 U/L (ref 5–37)
Albumin: 4.1 g/dL (ref 3.5–5.2)
Alkaline Phosphatase: 88 U/L (ref 39–117)
Bilirubin, Direct: 0.1 mg/dL (ref 0.1–0.3)
Total Bilirubin: 0.4 mg/dL (ref 0.2–1.2)
Total Protein: 6.6 g/dL (ref 6.0–8.3)

## 2024-04-03 LAB — BASIC METABOLIC PANEL WITH GFR
BUN: 17 mg/dL (ref 6–23)
CO2: 29 meq/L (ref 19–32)
Calcium: 9.2 mg/dL (ref 8.4–10.5)
Chloride: 102 meq/L (ref 96–112)
Creatinine, Ser: 0.98 mg/dL (ref 0.40–1.20)
GFR: 64.43 mL/min
Glucose, Bld: 97 mg/dL (ref 70–99)
Potassium: 4.4 meq/L (ref 3.5–5.1)
Sodium: 140 meq/L (ref 135–145)

## 2024-04-03 LAB — LIPID PANEL
Cholesterol: 150 mg/dL (ref 28–200)
HDL: 38.9 mg/dL — ABNORMAL LOW
LDL Cholesterol: 75 mg/dL (ref 10–99)
NonHDL: 110.85
Total CHOL/HDL Ratio: 4
Triglycerides: 179 mg/dL — ABNORMAL HIGH (ref 10.0–149.0)
VLDL: 35.8 mg/dL (ref 0.0–40.0)

## 2024-04-03 LAB — TSH: TSH: 2.74 u[IU]/mL (ref 0.35–5.50)

## 2024-04-03 MED ORDER — ROSUVASTATIN CALCIUM 10 MG PO TABS: 10.0000 mg | ORAL_TABLET | Freq: Every day | ORAL | 6 refills | Status: AC

## 2024-04-03 MED ORDER — SERTRALINE HCL 100 MG PO TABS: 100.0000 mg | ORAL_TABLET | Freq: Every day | ORAL | 3 refills | Status: AC

## 2024-04-03 MED ORDER — MELOXICAM 15 MG PO TABS: 15.0000 mg | ORAL_TABLET | Freq: Every day | ORAL | 1 refills | Status: AC

## 2024-04-03 NOTE — Assessment & Plan Note (Signed)
 Chronic problem.  On Crestor  10mg  daily.  Currently asymptomatic.  Check labs and adjust meds prn.

## 2024-04-03 NOTE — Assessment & Plan Note (Signed)
 pt reports increased stress and anxiety recently.  BP normalized by end of visit and pt is asymptomatic.  No meds at this time but will continue to follow.

## 2024-04-03 NOTE — Patient Instructions (Signed)
 Schedule your complete physical in 6 months We'll notify you of your lab results and make any changes if needed Continue to work on healthy diet and regular exercise- you can do it! Call with any questions or concerns Stay Safe!  Stay Healthy! Happy New Year!!!

## 2024-04-03 NOTE — Assessment & Plan Note (Signed)
 Ongoing issue.  Pt has gained 4 lbs and BMI is now 36.15.  Coupled w/ her hyperlipidemia, the qualifies as morbid obesity.  Discussed the importance of low carb diet and regular exercise.  Check labs to risk stratify.  Will follow.

## 2024-04-03 NOTE — Progress Notes (Addendum)
" ° °  Subjective:    Patient ID: Jacqueline Mcfarland, female    DOB: 1966/02/24, 59 y.o.   MRN: [id number]  HPI Hyperlipidemia- chronic problem, on Crestor  10mg  daily.  No CP, SOB, abd pain, N/V  Obesity- Pt has gained 4 lbs since last visit.  BMI now 36.15.  no regular exercise.  Elevated BP- pt has no hx of similar.  Pt reports high levels of stress right now- work, husband is not doing well after back surgery.   Review of Systems For ROS see HPI     Objective:   Physical Exam Vitals reviewed.  Constitutional:      General: She is not in acute distress.    Appearance: Normal appearance. She is well-developed. She is not ill-appearing.  HENT:     Head: Normocephalic and atraumatic.  Eyes:     Conjunctiva/sclera: Conjunctivae normal.     Pupils: Pupils are equal, round, and reactive to light.  Neck:     Thyroid : No thyromegaly.  Cardiovascular:     Rate and Rhythm: Normal rate and regular rhythm.     Pulses: Normal pulses.     Heart sounds: Normal heart sounds. No murmur heard. Pulmonary:     Effort: Pulmonary effort is normal. No respiratory distress.     Breath sounds: Normal breath sounds.  Abdominal:     General: There is no distension.     Palpations: Abdomen is soft.     Tenderness: There is no abdominal tenderness.  Musculoskeletal:     Cervical back: Normal range of motion and neck supple.     Right lower leg: No edema.     Left lower leg: No edema.  Lymphadenopathy:     Cervical: No cervical adenopathy.  Skin:    General: Skin is warm and dry.  Neurological:     General: No focal deficit present.     Mental Status: She is alert and oriented to person, place, and time.  Psychiatric:        Mood and Affect: Mood normal.        Behavior: Behavior normal.        Thought Content: Thought content normal.           Assessment & Plan:    "

## 2024-04-04 ENCOUNTER — Ambulatory Visit: Payer: Self-pay | Admitting: Family Medicine

## 2024-09-26 ENCOUNTER — Encounter: Admitting: Family Medicine

## 2024-10-08 ENCOUNTER — Encounter: Admitting: Family Medicine
